# Patient Record
Sex: Male | Born: 1955 | Hispanic: No | Marital: Married | Smoking: Never smoker
Health system: Southern US, Community
[De-identification: ages and names within clinical notes are randomized; demographics above are authoritative.]

## PROBLEM LIST (undated history)

## (undated) DIAGNOSIS — M1990 Unspecified osteoarthritis, unspecified site: Secondary | ICD-10-CM

## (undated) DIAGNOSIS — J45909 Unspecified asthma, uncomplicated: Secondary | ICD-10-CM

## (undated) DIAGNOSIS — C801 Malignant (primary) neoplasm, unspecified: Secondary | ICD-10-CM

## (undated) DIAGNOSIS — R011 Cardiac murmur, unspecified: Secondary | ICD-10-CM

## (undated) DIAGNOSIS — H9190 Unspecified hearing loss, unspecified ear: Secondary | ICD-10-CM

## (undated) DIAGNOSIS — J189 Pneumonia, unspecified organism: Secondary | ICD-10-CM

## (undated) DIAGNOSIS — G4730 Sleep apnea, unspecified: Secondary | ICD-10-CM

## (undated) HISTORY — PX: COLONOSCOPY W/ BIOPSIES: SHX1374

## (undated) HISTORY — PX: LYMPH NODE BIOPSY: SHX201

## (undated) HISTORY — PX: FRACTURE SURGERY: SHX138

## (undated) HISTORY — PX: OTHER SURGICAL HISTORY: SHX169

---

## 2003-04-16 DIAGNOSIS — C801 Malignant (primary) neoplasm, unspecified: Secondary | ICD-10-CM

## 2003-04-16 HISTORY — DX: Malignant (primary) neoplasm, unspecified: C80.1

## 2003-06-27 ENCOUNTER — Encounter: Admission: RE | Admit: 2003-06-27 | Discharge: 2003-06-27 | Payer: Self-pay | Admitting: Orthopedic Surgery

## 2010-04-15 HISTORY — PX: OTHER SURGICAL HISTORY: SHX169

## 2013-02-15 ENCOUNTER — Other Ambulatory Visit: Payer: Self-pay | Admitting: Orthopedic Surgery

## 2013-02-22 ENCOUNTER — Encounter (HOSPITAL_COMMUNITY): Payer: Self-pay

## 2013-02-22 ENCOUNTER — Ambulatory Visit (HOSPITAL_COMMUNITY)
Admission: RE | Admit: 2013-02-22 | Discharge: 2013-02-22 | Disposition: A | Discharge status: Home / Self Care | Payer: BC Managed Care – PPO | Source: Ambulatory Visit | Attending: Orthopedic Surgery | Admitting: Orthopedic Surgery

## 2013-02-22 ENCOUNTER — Encounter (HOSPITAL_COMMUNITY)
Admission: RE | Admit: 2013-02-22 | Discharge: 2013-02-22 | Disposition: A | Discharge status: Home / Self Care | Payer: BC Managed Care – PPO | Source: Ambulatory Visit | Attending: Orthopedic Surgery | Admitting: Orthopedic Surgery

## 2013-02-22 DIAGNOSIS — C9110 Chronic lymphocytic leukemia of B-cell type not having achieved remission: Secondary | ICD-10-CM | POA: Insufficient documentation

## 2013-02-22 DIAGNOSIS — Z01818 Encounter for other preprocedural examination: Secondary | ICD-10-CM | POA: Insufficient documentation

## 2013-02-22 DIAGNOSIS — G4733 Obstructive sleep apnea (adult) (pediatric): Secondary | ICD-10-CM | POA: Insufficient documentation

## 2013-02-22 HISTORY — DX: Sleep apnea, unspecified: G47.30

## 2013-02-22 HISTORY — DX: Malignant (primary) neoplasm, unspecified: C80.1

## 2013-02-22 LAB — COMPREHENSIVE METABOLIC PANEL
ALT: 48 U/L (ref 0–53)
Alkaline Phosphatase: 95 U/L (ref 39–117)
BUN: 15 mg/dL (ref 6–23)
Calcium: 10.2 mg/dL (ref 8.4–10.5)
Creatinine, Ser: 1.01 mg/dL (ref 0.50–1.35)
GFR calc Af Amer: >90 mL/min (ref >90)
Glucose, Bld: 87 mg/dL (ref 70–99)
Sodium: 145 mEq/L (ref 135–145)
Total Protein: 7.4 g/dL (ref 6.0–8.3)

## 2013-02-22 LAB — CBC WITH DIFFERENTIAL/PLATELET
Basophils Absolute: 0 10*3/uL (ref 0.0–0.1)
Basophils Relative: 0 % (ref 0–1)
Eosinophils Absolute: 0 10*3/uL (ref 0.0–0.7)
HCT: 44.2 % (ref 39.0–52.0)
Hemoglobin: 15 g/dL (ref 13.0–17.0)
Lymphs Abs: 23.2 10*3/uL — ABNORMAL HIGH (ref 0.7–4.0)
MCH: 29.6 pg (ref 26.0–34.0)
MCHC: 33.9 g/dL (ref 30.0–36.0)
Monocytes Absolute: 0.9 10*3/uL (ref 0.1–1.0)
Neutro Abs: 6.4 10*3/uL (ref 1.7–7.7)
Neutrophils Relative %: 21 % — ABNORMAL LOW (ref 43–77)
Platelets: 167 10*3/uL (ref 150–400)

## 2013-02-22 LAB — URINALYSIS, ROUTINE W REFLEX MICROSCOPIC
Glucose, UA: NEGATIVE mg/dL
Ketones, ur: NEGATIVE mg/dL
Leukocytes, UA: NEGATIVE
Protein, ur: NEGATIVE mg/dL
Specific Gravity, Urine: 1.018 (ref 1.005–1.030)
Urobilinogen, UA: 1 mg/dL (ref 0.0–1.0)

## 2013-02-22 LAB — TYPE AND SCREEN
ABO/RH(D): O POS
Antibody Screen: NEGATIVE

## 2013-02-22 LAB — PROTIME-INR
INR: 0.91 (ref 0.00–1.49)
Prothrombin Time: 12.1 seconds (ref 11.6–15.2)

## 2013-02-22 LAB — SURGICAL PCR SCREEN: Staphylococcus aureus: NEGATIVE

## 2013-02-22 LAB — ABO/RH: ABO/RH(D): O POS

## 2013-02-22 MED ORDER — CHLORHEXIDINE GLUCONATE 4 % EX LIQD: 60.0000 mL | Freq: Once | CUTANEOUS | Status: DC

## 2013-02-22 NOTE — Pre-Procedure Instructions (Addendum)
Prentis Langdon  02/22/2013   Your procedure is scheduled on:  Monday March 01, 2013 @ 0730  Report to [facility] Short Stay Entrance A at 0530 AM.  Call this number if you have problems the morning of surgery: [phone]   Remember:   Do not eat food or drink liquids after midnight.   Take these medicines the morning of surgery with A SIP OF WATER: acetaminophen (Tylenol) if needed, cetirizine (Zyrtec), tramadol (Ultram) if needed   Do not wear jewelry.  Do not wear lotions, powders, or colognes. You may wear deodorant.   Men may shave face and neck.  Do not bring valuables to the hospital.  [facility] is not responsible                  for any belongings or valuables.               Contacts, dentures or bridgework may not be worn into surgery.  Leave suitcase in the car. After surgery it may be brought to your room.  For patients admitted to the hospital, discharge time is determined by your                treatment team.               Special Instructions:    Please read over the following fact sheets that you were given: Pain Booklet, Coughing and Deep Breathing, Blood Transfusion Information, MRSA Information and Surgical Site Infection Prevention

## 2013-02-22 NOTE — Progress Notes (Signed)
Pt reports PCP is Dr. Tanda Rockers and that EKG was done within the last month, records requested for office visit, medical clearance and EKG. Pt reports that Dr. Tanda Rockers is with [facility] in [place] [phone].  Pt denies having a Cardiologist, stress test or ECHO.

## 2013-02-22 NOTE — Progress Notes (Signed)
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 Chart given to [place], [place] for review.

## 2013-02-22 NOTE — Pre-Procedure Instructions (Deleted)
Sean Parks  02/22/2013   Your procedure is scheduled on:  Mon, Nov 17 @ 7:30 AM  Report to [facility] Short Stay Entrance A at 5:30 AM.  Call this number if you have problems the morning of surgery: [phone]   Remember:   Do not eat food or drink liquids after midnight.   Take these medicines the morning of surgery with A SIP OF WATER:    Do not wear jewelry  Do not wear lotions, powders, or colognes. You may wear deodorant.  Men may shave face and neck.  Do not bring valuables to the hospital.  [facility] is not responsible                  for any belongings or valuables.               Contacts, dentures or bridgework may not be worn into surgery.  Leave suitcase in the car. After surgery it may be brought to your room.  For patients admitted to the hospital, discharge time is determined by your                treatment team.                   Special Instructions: Shower using CHG 2 nights before surgery and the night before surgery.  If you shower the day of surgery use CHG.  Use special wash - you have one bottle of CHG for all showers.  You should use approximately 1/3 of the bottle for each shower.   Please read over the following fact sheets that you were given: Pain Booklet, Coughing and Deep Breathing, Blood Transfusion Information, MRSA Information and Surgical Site Infection Prevention

## 2013-02-23 ENCOUNTER — Encounter (HOSPITAL_COMMUNITY): Payer: Self-pay

## 2013-02-23 LAB — PATHOLOGIST SMEAR REVIEW

## 2013-02-23 LAB — URINE CULTURE: Culture: NO GROWTH

## 2013-02-23 NOTE — Progress Notes (Addendum)
Anesthesia Chart Review:  Patient is a 57 year old male scheduled for left TKA on 03/01/13 by Dr. Sherlean Foot.  History includes non-smoker, OSA wears CPAP, morbid obesity, CLL. PCP is Dr. Tanda Rockers with [facility] in [place].   CXR on 02/22/13 showed no acute cardiopulmonary process.   Preoperative labs noted. Urine culture is still pending.  Medical clearance and EKG are pending from Dr. Tanda Rockers.  Will follow-up records tomorrow.  Velna Ochs [facility] Short Stay Center/Anesthesiology Phone [phone] 02/23/2013 5:49 PM  Addendum: 02/24/2013 4:00 PM I spoke with Keri at Dr. Tobin Chad office.  She will have Dr. Sherlean Foot or PA review CBC.  She did fax over his medical clearance note from Dr. Tanda Rockers along with his EKG done there on 01/29/13 that showed SR, inferior infarct (age undetermined), low voltage QRS in precordial leads. This was already reviewed by Dr. Tanda Rockers who checked "clearance for surgery Medical & Cardiac standpoint."  He did recommend continuing CPAP.  I also obtained oncology records from [facility]. Patient sees Dr. Doreatha Massed, last visit 01/04/13 for follow-up stage I chronic lymphocytic leukemia.  He was aware of plans for patient to undergo TKR and felt patient could proceed from his standpoint.  Continued follow-up in six months recommended.  WBC then was 28.7. His WBC is now 30.5.  Urine culture was negative.  Patient has both medical and oncology clearance.  This WBC is consistent with his known diagnosis of CLL.  If no acute changes then I would anticipate that he could proceed as planned.

## 2013-02-28 MED ORDER — DEXTROSE 5 % IV SOLN
3.0000 g | INTRAVENOUS | Status: DC
  Filled 2013-02-28: qty 3000

## 2013-03-01 ENCOUNTER — Encounter (HOSPITAL_COMMUNITY)
Admission: RE | Disposition: A | Discharge status: Home Health | Payer: Self-pay | Source: Ambulatory Visit | Attending: Orthopedic Surgery

## 2013-03-01 ENCOUNTER — Inpatient Hospital Stay (HOSPITAL_COMMUNITY)
Admission: RE | Admit: 2013-03-01 | Discharge: 2013-03-02 | DRG: 470 | Disposition: A | Discharge status: Home Health | Payer: BC Managed Care – PPO | Source: Ambulatory Visit | Attending: Orthopedic Surgery | Admitting: Orthopedic Surgery

## 2013-03-01 ENCOUNTER — Encounter (HOSPITAL_COMMUNITY): Payer: Self-pay | Admitting: Certified Registered"

## 2013-03-01 ENCOUNTER — Inpatient Hospital Stay (HOSPITAL_COMMUNITY): Payer: BC Managed Care – PPO | Admitting: Certified Registered"

## 2013-03-01 ENCOUNTER — Encounter (HOSPITAL_COMMUNITY): Payer: BC Managed Care – PPO | Admitting: Vascular Surgery

## 2013-03-01 DIAGNOSIS — C9110 Chronic lymphocytic leukemia of B-cell type not having achieved remission: Secondary | ICD-10-CM | POA: Diagnosis present

## 2013-03-01 DIAGNOSIS — G4730 Sleep apnea, unspecified: Secondary | ICD-10-CM | POA: Diagnosis present

## 2013-03-01 DIAGNOSIS — M1710 Unilateral primary osteoarthritis, unspecified knee: Principal | ICD-10-CM | POA: Diagnosis present

## 2013-03-01 DIAGNOSIS — Z6841 Body Mass Index (BMI) 40.0 and over, adult: Secondary | ICD-10-CM

## 2013-03-01 DIAGNOSIS — Z96652 Presence of left artificial knee joint: Secondary | ICD-10-CM

## 2013-03-01 DIAGNOSIS — D62 Acute posthemorrhagic anemia: Secondary | ICD-10-CM | POA: Diagnosis not present

## 2013-03-01 HISTORY — PX: TOTAL KNEE ARTHROPLASTY: SHX125

## 2013-03-01 LAB — CBC
HCT: 41.5 % (ref 39.0–52.0)
MCH: 28.7 pg (ref 26.0–34.0)
MCHC: 33 g/dL (ref 30.0–36.0)
Platelets: 173 10*3/uL (ref 150–400)
RDW: 13.6 % (ref 11.5–15.5)
WBC: 37.8 10*3/uL — ABNORMAL HIGH (ref 4.0–10.5)

## 2013-03-01 LAB — CREATININE, SERUM
GFR calc Af Amer: >90 mL/min (ref >90)
GFR calc non Af Amer: >90 mL/min (ref >90)

## 2013-03-01 SURGERY — ARTHROPLASTY, KNEE, TOTAL
Anesthesia: Regional | Site: Knee | Laterality: Left | Wound class: Clean

## 2013-03-01 MED ORDER — BUPIVACAINE-EPINEPHRINE PF 0.5-1:200000 % IJ SOLN
INTRAMUSCULAR | Status: DC | PRN
  Administered 2013-03-01: 20 mL

## 2013-03-01 MED ORDER — PHENOL 1.4 % MT LIQD: 1.0000 | OROMUCOSAL | Status: DC | PRN

## 2013-03-01 MED ORDER — SENNOSIDES-DOCUSATE SODIUM 8.6-50 MG PO TABS: 1.0000 | ORAL_TABLET | Freq: Every evening | ORAL | Status: DC | PRN

## 2013-03-01 MED ORDER — NEOSTIGMINE METHYLSULFATE 1 MG/ML IJ SOLN
INTRAMUSCULAR | Status: DC | PRN
  Administered 2013-03-01: 4 mg via INTRAVENOUS

## 2013-03-01 MED ORDER — METHOCARBAMOL 500 MG PO TABS
500.0000 mg | ORAL_TABLET | Freq: Four times a day (QID) | ORAL | Status: DC | PRN
  Administered 2013-03-01 – 2013-03-02 (×3): 500 mg via ORAL
  Filled 2013-03-01 (×2): qty 1

## 2013-03-01 MED ORDER — CEFAZOLIN SODIUM-DEXTROSE 2-3 GM-% IV SOLR
2.0000 g | Freq: Four times a day (QID) | INTRAVENOUS | Status: AC
  Administered 2013-03-01 (×2): 2 g via INTRAVENOUS
  Filled 2013-03-01 (×2): qty 50

## 2013-03-01 MED ORDER — HYDROMORPHONE HCL PF 1 MG/ML IJ SOLN
INTRAMUSCULAR | Status: AC
  Administered 2013-03-01: 0.5 mg via INTRAVENOUS
  Filled 2013-03-01: qty 1

## 2013-03-01 MED ORDER — OXYCODONE HCL 5 MG PO TABS: 5.0000 mg | ORAL_TABLET | Freq: Once | ORAL | Status: DC | PRN

## 2013-03-01 MED ORDER — FENTANYL CITRATE 0.05 MG/ML IJ SOLN
INTRAMUSCULAR | Status: DC | PRN
  Administered 2013-03-01: 100 ug via INTRAVENOUS
  Administered 2013-03-01 (×4): 50 ug via INTRAVENOUS

## 2013-03-01 MED ORDER — PROPOFOL 10 MG/ML IV BOLUS
INTRAVENOUS | Status: DC | PRN
  Administered 2013-03-01: 200 mg via INTRAVENOUS

## 2013-03-01 MED ORDER — DOCUSATE SODIUM 100 MG PO CAPS
100.0000 mg | ORAL_CAPSULE | Freq: Two times a day (BID) | ORAL | Status: DC
  Administered 2013-03-01 – 2013-03-02 (×3): 100 mg via ORAL
  Filled 2013-03-01 (×4): qty 1

## 2013-03-01 MED ORDER — ONDANSETRON HCL 4 MG/2ML IJ SOLN
INTRAMUSCULAR | Status: DC | PRN
  Administered 2013-03-01: 4 mg via INTRAVENOUS

## 2013-03-01 MED ORDER — ZOLPIDEM TARTRATE 5 MG PO TABS: 5.0000 mg | ORAL_TABLET | Freq: Every evening | ORAL | Status: DC | PRN

## 2013-03-01 MED ORDER — SODIUM CHLORIDE 0.9 % IR SOLN
Status: DC | PRN
  Administered 2013-03-01: 3000 mL

## 2013-03-01 MED ORDER — ONDANSETRON HCL 4 MG PO TABS: 4.0000 mg | ORAL_TABLET | Freq: Four times a day (QID) | ORAL | Status: DC | PRN

## 2013-03-01 MED ORDER — MINOCYCLINE HCL 50 MG PO CAPS
50.0000 mg | ORAL_CAPSULE | Freq: Every day | ORAL | Status: DC | PRN
  Filled 2013-03-01: qty 1

## 2013-03-01 MED ORDER — METHOCARBAMOL 100 MG/ML IJ SOLN
500.0000 mg | Freq: Four times a day (QID) | INTRAVENOUS | Status: DC | PRN
  Filled 2013-03-01: qty 5

## 2013-03-01 MED ORDER — GLYCOPYRROLATE 0.2 MG/ML IJ SOLN
INTRAMUSCULAR | Status: DC | PRN
  Administered 2013-03-01: 0.6 mg via INTRAVENOUS

## 2013-03-01 MED ORDER — LORATADINE 10 MG PO TABS
10.0000 mg | ORAL_TABLET | Freq: Every day | ORAL | Status: DC
  Administered 2013-03-01 – 2013-03-02 (×2): 10 mg via ORAL
  Filled 2013-03-01 (×2): qty 1

## 2013-03-01 MED ORDER — METOCLOPRAMIDE HCL 5 MG/ML IJ SOLN: 5.0000 mg | Freq: Three times a day (TID) | INTRAMUSCULAR | Status: DC | PRN

## 2013-03-01 MED ORDER — BUPIVACAINE LIPOSOME 1.3 % IJ SUSP
INTRAMUSCULAR | Status: DC | PRN
  Administered 2013-03-01: 20 mL

## 2013-03-01 MED ORDER — ONDANSETRON HCL 4 MG/2ML IJ SOLN: 4.0000 mg | Freq: Four times a day (QID) | INTRAMUSCULAR | Status: DC | PRN

## 2013-03-01 MED ORDER — FLEET ENEMA 7-19 GM/118ML RE ENEM: 1.0000 | ENEMA | Freq: Once | RECTAL | Status: AC | PRN

## 2013-03-01 MED ORDER — HYDROMORPHONE HCL PF 1 MG/ML IJ SOLN
1.0000 mg | INTRAMUSCULAR | Status: DC | PRN
  Administered 2013-03-01 (×2): 1 mg via INTRAVENOUS
  Filled 2013-03-01 (×2): qty 1

## 2013-03-01 MED ORDER — BISACODYL 5 MG PO TBEC: 5.0000 mg | DELAYED_RELEASE_TABLET | Freq: Every day | ORAL | Status: DC | PRN

## 2013-03-01 MED ORDER — ROCURONIUM BROMIDE 100 MG/10ML IV SOLN
INTRAVENOUS | Status: DC | PRN
  Administered 2013-03-01: 50 mg via INTRAVENOUS

## 2013-03-01 MED ORDER — BUPIVACAINE LIPOSOME 1.3 % IJ SUSP
20.0000 mL | Freq: Once | INTRAMUSCULAR | Status: DC
  Filled 2013-03-01: qty 20

## 2013-03-01 MED ORDER — OXYCODONE HCL ER 20 MG PO T12A
20.0000 mg | EXTENDED_RELEASE_TABLET | Freq: Two times a day (BID) | ORAL | Status: DC
  Administered 2013-03-01 – 2013-03-02 (×3): 20 mg via ORAL
  Filled 2013-03-01 (×3): qty 2

## 2013-03-01 MED ORDER — LACTATED RINGERS IV SOLN
INTRAVENOUS | Status: DC | PRN
  Administered 2013-03-01 (×2): via INTRAVENOUS

## 2013-03-01 MED ORDER — LIDOCAINE HCL (CARDIAC) 20 MG/ML IV SOLN
INTRAVENOUS | Status: DC | PRN
  Administered 2013-03-01: 80 mg via INTRAVENOUS

## 2013-03-01 MED ORDER — ACETAMINOPHEN 325 MG PO TABS: 650.0000 mg | ORAL_TABLET | Freq: Four times a day (QID) | ORAL | Status: DC | PRN

## 2013-03-01 MED ORDER — ALUM & MAG HYDROXIDE-SIMETH 200-200-20 MG/5ML PO SUSP: 30.0000 mL | ORAL | Status: DC | PRN

## 2013-03-01 MED ORDER — TRANEXAMIC ACID 100 MG/ML IV SOLN
1000.0000 mg | INTRAVENOUS | Status: AC
  Administered 2013-03-01: 1000 mg via INTRAVENOUS
  Filled 2013-03-01: qty 10

## 2013-03-01 MED ORDER — HYDROMORPHONE HCL PF 1 MG/ML IJ SOLN
0.2500 mg | INTRAMUSCULAR | Status: DC | PRN
  Administered 2013-03-01 (×2): 0.5 mg via INTRAVENOUS

## 2013-03-01 MED ORDER — ACETAMINOPHEN 650 MG RE SUPP: 650.0000 mg | Freq: Four times a day (QID) | RECTAL | Status: DC | PRN

## 2013-03-01 MED ORDER — SODIUM CHLORIDE 0.9 % IV SOLN
INTRAVENOUS | Status: DC
  Administered 2013-03-01: 1 mL via INTRAVENOUS

## 2013-03-01 MED ORDER — DEXTROSE 5 % IV SOLN
3.0000 g | INTRAVENOUS | Status: DC | PRN
  Administered 2013-03-01: 3 g via INTRAVENOUS

## 2013-03-01 MED ORDER — MIDAZOLAM HCL 5 MG/5ML IJ SOLN
INTRAMUSCULAR | Status: DC | PRN
  Administered 2013-03-01: 2 mg via INTRAVENOUS

## 2013-03-01 MED ORDER — OXYCODONE HCL 5 MG PO TABS
ORAL_TABLET | ORAL | Status: AC
  Filled 2013-03-01: qty 2

## 2013-03-01 MED ORDER — BUPIVACAINE-EPINEPHRINE PF 0.5-1:200000 % IJ SOLN
INTRAMUSCULAR | Status: DC | PRN
  Administered 2013-03-01: 30 mL

## 2013-03-01 MED ORDER — CELECOXIB 200 MG PO CAPS
200.0000 mg | ORAL_CAPSULE | Freq: Two times a day (BID) | ORAL | Status: DC
  Administered 2013-03-01 – 2013-03-02 (×3): 200 mg via ORAL
  Filled 2013-03-01 (×4): qty 1

## 2013-03-01 MED ORDER — OXYCODONE HCL 5 MG/5ML PO SOLN: 5.0000 mg | Freq: Once | ORAL | Status: DC | PRN

## 2013-03-01 MED ORDER — ENOXAPARIN SODIUM 30 MG/0.3ML ~~LOC~~ SOLN
30.0000 mg | Freq: Two times a day (BID) | SUBCUTANEOUS | Status: DC
  Administered 2013-03-02: 30 mg via SUBCUTANEOUS
  Filled 2013-03-01 (×3): qty 0.3

## 2013-03-01 MED ORDER — BUPIVACAINE-EPINEPHRINE (PF) 0.5% -1:200000 IJ SOLN
INTRAMUSCULAR | Status: AC
  Filled 2013-03-01: qty 10

## 2013-03-01 MED ORDER — OXYCODONE HCL 5 MG PO TABS
5.0000 mg | ORAL_TABLET | ORAL | Status: DC | PRN
  Administered 2013-03-01 – 2013-03-02 (×7): 10 mg via ORAL
  Filled 2013-03-01 (×6): qty 2

## 2013-03-01 MED ORDER — DIPHENHYDRAMINE HCL 12.5 MG/5ML PO ELIX: 12.5000 mg | ORAL_SOLUTION | ORAL | Status: DC | PRN

## 2013-03-01 MED ORDER — METOCLOPRAMIDE HCL 10 MG PO TABS: 5.0000 mg | ORAL_TABLET | Freq: Three times a day (TID) | ORAL | Status: DC | PRN

## 2013-03-01 MED ORDER — ARTIFICIAL TEARS OP OINT
TOPICAL_OINTMENT | OPHTHALMIC | Status: DC | PRN
  Administered 2013-03-01: 1 via OPHTHALMIC

## 2013-03-01 MED ORDER — METHOCARBAMOL 500 MG PO TABS
ORAL_TABLET | ORAL | Status: AC
  Filled 2013-03-01: qty 1

## 2013-03-01 MED ORDER — MENTHOL 3 MG MT LOZG: 1.0000 | LOZENGE | OROMUCOSAL | Status: DC | PRN

## 2013-03-01 SURGICAL SUPPLY — 63 items
BANDAGE ESMARK 6X9 LF (GAUZE/BANDAGES/DRESSINGS) ×1 IMPLANT
BLADE SAGITTAL 13X1.27X60 (BLADE) ×2 IMPLANT
BLADE SAW SGTL 83.5X18.5 (BLADE) ×2 IMPLANT
BLADE SURG 10 STRL SS (BLADE) ×2 IMPLANT
BNDG ESMARK 6X9 LF (GAUZE/BANDAGES/DRESSINGS) ×2
BOWL SMART MIX CTS (DISPOSABLE) ×2 IMPLANT
CAP POR TM CP VIT E LN CER HD ×2 IMPLANT
CEMENT BONE SIMPLEX SPEEDSET (Cement) ×4 IMPLANT
CLOTH BEACON ORANGE TIMEOUT ST (SAFETY) IMPLANT
COVER SURGICAL LIGHT HANDLE (MISCELLANEOUS) ×2 IMPLANT
CUFF TOURNIQUET SINGLE 34IN LL (TOURNIQUET CUFF) ×2 IMPLANT
DRAPE EXTREMITY T 121X128X90 (DRAPE) ×2 IMPLANT
DRAPE INCISE IOBAN 66X45 STRL (DRAPES) ×4 IMPLANT
DRAPE PROXIMA HALF (DRAPES) ×2 IMPLANT
DRAPE U-SHAPE 47X51 STRL (DRAPES) ×2 IMPLANT
DRSG ADAPTIC 3X8 NADH LF (GAUZE/BANDAGES/DRESSINGS) ×2 IMPLANT
DRSG PAD ABDOMINAL 8X10 ST (GAUZE/BANDAGES/DRESSINGS) ×2 IMPLANT
DURAPREP 26ML APPLICATOR (WOUND CARE) ×2 IMPLANT
ELECT REM PT RETURN 9FT ADLT (ELECTROSURGICAL) ×2
ELECTRODE REM PT RTRN 9FT ADLT (ELECTROSURGICAL) ×1 IMPLANT
EVACUATOR 1/8 PVC DRAIN (DRAIN) ×2 IMPLANT
GLOVE BIO SURGEON STRL SZ 6.5 (GLOVE) ×2 IMPLANT
GLOVE BIOGEL M 7.0 STRL (GLOVE) IMPLANT
GLOVE BIOGEL PI IND STRL 6.5 (GLOVE) ×1 IMPLANT
GLOVE BIOGEL PI IND STRL 7.5 (GLOVE) ×1 IMPLANT
GLOVE BIOGEL PI IND STRL 8.5 (GLOVE) ×3 IMPLANT
GLOVE BIOGEL PI INDICATOR 6.5 (GLOVE) ×1
GLOVE BIOGEL PI INDICATOR 7.5 (GLOVE) ×1
GLOVE BIOGEL PI INDICATOR 8.5 (GLOVE) ×3
GLOVE SURG ORTHO 8.0 STRL STRW (GLOVE) ×4 IMPLANT
GLOVE SURG SS PI 8.5 STRL IVOR (GLOVE) ×1
GLOVE SURG SS PI 8.5 STRL STRW (GLOVE) ×1 IMPLANT
GOWN PREVENTION PLUS XLARGE (GOWN DISPOSABLE) ×4 IMPLANT
GOWN SRG XL XLNG 56XLVL 4 (GOWN DISPOSABLE) ×1 IMPLANT
GOWN STRL NON-REIN LRG LVL3 (GOWN DISPOSABLE) IMPLANT
GOWN STRL NON-REIN XL XLG LVL4 (GOWN DISPOSABLE) ×1
GOWN STRL REIN 2XL XLG LVL4 (GOWN DISPOSABLE) ×2 IMPLANT
HANDPIECE INTERPULSE COAX TIP (DISPOSABLE) ×1
HOOD PEEL AWAY FACE SHEILD DIS (HOOD) ×8 IMPLANT
KIT BASIN OR (CUSTOM PROCEDURE TRAY) ×2 IMPLANT
KIT ROOM TURNOVER OR (KITS) ×2 IMPLANT
MANIFOLD NEPTUNE II (INSTRUMENTS) ×2 IMPLANT
NEEDLE 22X1 1/2 (OR ONLY) (NEEDLE) ×2 IMPLANT
NEEDLE HYPO 21X1.5 SAFETY (NEEDLE) IMPLANT
NS IRRIG 1000ML POUR BTL (IV SOLUTION) ×2 IMPLANT
PACK TOTAL JOINT (CUSTOM PROCEDURE TRAY) ×2 IMPLANT
PAD ARMBOARD 7.5X6 YLW CONV (MISCELLANEOUS) ×4 IMPLANT
PADDING CAST COTTON 6X4 STRL (CAST SUPPLIES) ×2 IMPLANT
SET HNDPC FAN SPRY TIP SCT (DISPOSABLE) ×1 IMPLANT
SPONGE GAUZE 4X4 12PLY (GAUZE/BANDAGES/DRESSINGS) ×2 IMPLANT
STAPLER VISISTAT 35W (STAPLE) ×2 IMPLANT
SUCTION FRAZIER TIP 10 FR DISP (SUCTIONS) ×2 IMPLANT
SUT BONE WAX W31G (SUTURE) ×2 IMPLANT
SUT VIC AB 0 CTB1 27 (SUTURE) ×4 IMPLANT
SUT VIC AB 1 CT1 27 (SUTURE) ×4
SUT VIC AB 1 CT1 27XBRD ANBCTR (SUTURE) ×4 IMPLANT
SUT VIC AB 2-0 CT1 27 (SUTURE) ×2
SUT VIC AB 2-0 CT1 TAPERPNT 27 (SUTURE) ×2 IMPLANT
SYR CONTROL 10ML LL (SYRINGE) ×2 IMPLANT
TOWEL OR 17X24 6PK STRL BLUE (TOWEL DISPOSABLE) ×2 IMPLANT
TOWEL OR 17X26 10 PK STRL BLUE (TOWEL DISPOSABLE) ×2 IMPLANT
TRAY FOLEY CATH 16FRSI W/METER (SET/KITS/TRAYS/PACK) IMPLANT
WATER STERILE IRR 1000ML POUR (IV SOLUTION) ×2 IMPLANT

## 2013-03-01 NOTE — Anesthesia Postprocedure Evaluation (Signed)
Anesthesia Post Note  Patient: Sean Parks  Procedure(s) Performed: Procedure(s) (LRB): TOTAL KNEE ARTHROPLASTY (Left)  Anesthesia type: General  Patient location: PACU  Post pain: Pain level controlled and Adequate analgesia  Post assessment: Post-op Vital signs reviewed, Patient's Cardiovascular Status Stable, Respiratory Function Stable, Patent Airway and Pain level controlled  Last Vitals:  Filed Vitals:   03/01/13 1045  BP: 149/80  Pulse: 105  Temp:   Resp: 10    Post vital signs: Reviewed and stable  Level of consciousness: awake, alert  and oriented  Complications: No apparent anesthesia complications

## 2013-03-01 NOTE — H&P (Signed)
Sean Parks MRN:  [id number] DOB/SEX:  1955-09-05/male  CHIEF COMPLAINT:  Painful left Knee  HISTORY: Patient is a 57 y.o. male presented with a history of pain in the left knee. Onset of symptoms was gradual starting several years ago with gradually worsening course since that time. Prior procedures on the knee include none. Patient has been treated conservatively with over-the-counter NSAIDs and activity modification. Patient currently rates pain in the knee at 10 out of 10 with activity. There is pain at night.  PAST MEDICAL HISTORY: There are no active problems to display for this patient.  Past Medical History  Diagnosis Date  . 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 Sleep apnea     Wears CPAP   Past Surgical History  Procedure Laterality Date  . Bone spurs   2012    Removed from L shoulder  . Fracture surgery      L foot when pt was 57 years old  . Colonoscopy w/ biopsies    . Lymph node biopsy       MEDICATIONS:   Prescriptions prior to admission  Medication Sig Dispense Refill  . acetaminophen (TYLENOL) 500 MG tablet Take 1,000 mg by mouth every 6 (six) hours as needed for mild pain.      . cetirizine-pseudoephedrine (ZYRTEC-D) 5-120 MG per tablet Take 1 tablet by mouth daily as needed for allergies.      . diphenhydramine-acetaminophen (TYLENOL PM) 25-500 MG TABS Take 2 tablets by mouth at bedtime as needed.      . minocycline (MINOCIN,DYNACIN) 50 MG capsule Take 50 mg by mouth daily as needed (flare up).      . traMADol (ULTRAM) 50 MG tablet Take 50-100 mg by mouth every 6 (six) hours as needed for moderate pain.        ALLERGIES:  No Known Allergies  REVIEW OF SYSTEMS:  Pertinent items are noted in HPI.   FAMILY HISTORY:  No family history on file.  SOCIAL HISTORY:   History  Substance Use Topics  . Smoking status: Never Smoker   . Smokeless tobacco: Never Used  . Alcohol Use: 1.2 oz/week    2 Glasses of wine per week     Comment: twice a week     EXAMINATION:  Vital signs in last 24 hours: Temp:  [97.1 F (36.2 C)] 97.1 F (36.2 C) (11/17 0555) Pulse Rate:  [95] 95 (11/17 0555) Resp:  [20] 20 (11/17 0555) BP: (128)/(64) 128/64 mmHg (11/17 0555) SpO2:  [97 %] 97 % (11/17 0555)  General appearance: alert, cooperative and no distress Lungs: clear to auscultation bilaterally Heart: regular rate and rhythm, S1, S2 normal, no murmur, click, rub or gallop Abdomen: soft, non-tender; bowel sounds normal; no masses,  no organomegaly Extremities: Homans sign is negative, no sign of DVT Pulses: 2+ and symmetric Skin: Skin color, texture, turgor normal. No rashes or lesions Neurologic: Alert and oriented X 3, normal strength and tone. Normal symmetric reflexes. Normal coordination and gait  Musculoskeletal:  ROM 0-115, Ligaments intact,  Imaging Review Plain radiographs demonstrate severe degenerative joint disease of the left knee. The overall alignment is mild valgus. The bone quality appears to be good for age and reported activity level.  Assessment/Plan: End stage arthritis, left knee   The patient history, physical examination and imaging studies are consistent with advanced degenerative joint disease of the left knee. The patient has failed conservative treatment.  The clearance notes were reviewed.  After discussion with the patient it was felt that Total Knee Replacement was indicated. The procedure,  risks, and benefits of total knee arthroplasty were presented and reviewed. The risks including but not limited to aseptic loosening, infection, blood clots, vascular injury, stiffness, patella tracking problems complications among others were discussed. The patient acknowledged the explanation, agreed to proceed with the plan.  Sean Parks 03/01/2013, 6:54 AM

## 2013-03-01 NOTE — Anesthesia Procedure Notes (Addendum)
Anesthesia Regional Block:  Adductor canal block  Pre-Anesthetic Checklist: ,, timeout performed, Correct Patient, Correct Site, Correct Laterality, Correct Procedure, Correct Position, site marked, Risks and benefits discussed,  Surgical consent,  Pre-op evaluation,  At surgeon's request and post-op pain management  Laterality: Left  Prep: chloraprep       Needles:  Injection technique: Single-shot  Needle Type: Echogenic Needle      Needle Gauge: 21 and 21 G    Additional Needles:  Procedures: ultrasound guided (picture in chart) Adductor canal block Narrative:  Start time: 03/01/2013 7:10 AM End time: 03/01/2013 7:22 AM Injection made incrementally with aspirations every 5 mL.  Performed by: Personally  Anesthesiologist: Dr Chaney Malling  Additional Notes: Pt tolerated the procedure well.  Adductor canal block Procedure Name: Intubation Date/Time: 03/01/2013 7:51 AM Performed by: Lanell Matar Pre-anesthesia Checklist: Patient identified, Timeout performed, Emergency Drugs available, Suction available and Patient being monitored Patient Re-evaluated:Patient Re-evaluated prior to inductionOxygen Delivery Method: Circle system utilized Preoxygenation: Pre-oxygenation with 100% oxygen Intubation Type: IV induction Ventilation: Two handed mask ventilation required and Oral airway inserted - appropriate to patient size Laryngoscope Size: Hyacinth Meeker and 2 Grade View: Grade II Tube type: Oral Tube size: 7.5 mm Number of attempts: 1 Airway Equipment and Method: Stylet Placement Confirmation: ETT inserted through vocal cords under direct vision,  positive ETCO2,  CO2 detector and breath sounds checked- equal and bilateral Secured at: 23 cm Tube secured with: Tape Dental Injury: Teeth and Oropharynx as per pre-operative assessment

## 2013-03-01 NOTE — Progress Notes (Signed)
Orthopedic Tech Progress Note Patient Details:  Sean Parks 1956-02-01 [id number] CPM applied to Left LE with appropriate settings. OHF applied to bed. Footsie roll provided.  CPM Left Knee CPM Left Knee: On Left Knee Flexion (Degrees): 90 Left Knee Extension (Degrees): 0   Asia R Thompson 03/01/2013, 11:03 AM

## 2013-03-01 NOTE — Evaluation (Signed)
Physical Therapy Evaluation Patient Details Name: Sean Parks MRN: [id number] DOB: 21-Nov-1955 Today's Date: 03/01/2013 Time: 6962-9528 PT Time Calculation (min): 24 min  PT Assessment / Plan / Recommendation History of Present Illness  Patient is a 57 yo male s/p Lt TKA.  Clinical Impression  Patient presents with problems listed below. Will benefit from acute PT to maximize independence prior to discharge home with wife.     PT Assessment  Patient needs continued PT services    Follow Up Recommendations  Home health PT;Supervision/Assistance - 24 hour    Does the patient have the potential to tolerate intense rehabilitation      Barriers to Discharge        Equipment Recommendations  None recommended by PT    Recommendations for Other Services     Frequency 7X/week    Precautions / Restrictions Precautions Precautions: Knee Precaution Booklet Issued: Yes (comment) Precaution Comments: Reviewed precautions with patient and wife. Restrictions Weight Bearing Restrictions: Yes LLE Weight Bearing: Weight bearing as tolerated   Pertinent Vitals/Pain       Mobility  Bed Mobility Bed Mobility: Supine to Sit;Sitting - Scoot to Edge of Bed Supine to Sit: 4: Min assist;HOB flat;With rails Sitting - Scoot to Edge of Bed: 4: Min guard;With rail Details for Bed Mobility Assistance: Verbal cues for technique.  Assist to move LLE off of bed.  Good sitting balance once upright. Transfers Transfers: Sit to Stand;Stand to Sit Sit to Stand: 4: Min assist;From elevated surface;With upper extremity assist;From bed Stand to Sit: 4: Min assist;With upper extremity assist;With armrests;To chair/3-in-1 Details for Transfer Assistance: Verbal cues for hand placement and technique.  Assist to rise to standing and for balance initially. Ambulation/Gait Ambulation/Gait Assistance: 4: Min assist Ambulation Distance (Feet): 14 Feet Assistive device: Rolling walker Ambulation/Gait  Assistance Details: Verbal cues for safe use of RW and gait sequence.  Cues to stand upright during gait. Gait Pattern: Step-to pattern;Decreased stance time - left;Decreased step length - right;Antalgic;Trunk flexed Gait velocity: Slow gait speed    Exercises Total Joint Exercises Ankle Circles/Pumps: AROM;Both;10 reps;Seated   PT Diagnosis: Difficulty walking;Acute pain  PT Problem List: Decreased strength;Decreased range of motion;Decreased activity tolerance;Decreased mobility;Decreased knowledge of use of DME;Decreased knowledge of precautions;Pain PT Treatment Interventions: DME instruction;Gait training;Stair training;Functional mobility training;Therapeutic exercise;Patient/family education     PT Goals(Current goals can be found in the care plan section) Acute Rehab PT Goals Patient Stated Goal: Home soon PT Goal Formulation: With patient/family Time For Goal Achievement: 03/08/13 Potential to Achieve Goals: Good  Visit Information  Last PT Received On: 03/01/13 Assistance Needed: +1 History of Present Illness: Patient is a 57 yo male s/p Lt TKA.       Prior Functioning  Home Living Family/patient expects to be discharged to:: Private residence Living Arrangements: Spouse/significant other Available Help at Discharge: Family;Available 24 hours/day Type of Home: House Home Access: Stairs to enter [employer] of Steps: 1 Entrance Stairs-Rails: None Home Layout: One level Home Equipment: Walker - 2 wheels;Bedside commode Prior Function Level of Independence: Independent Communication Communication: No difficulties    Cognition  Cognition Arousal/Alertness: Awake/alert Behavior During Therapy: WFL for tasks assessed/performed Overall Cognitive Status: Within Functional Limits for tasks assessed    Extremity/Trunk Assessment Upper Extremity Assessment Upper Extremity Assessment: Overall WFL for tasks assessed Lower Extremity Assessment Lower Extremity  Assessment: LLE deficits/detail LLE Deficits / Details: Decreased strength and ROM due to surgery/pain.  Patient able to assist with moving LLE off of bed. LLE:  Unable to fully assess due to pain Cervical / Trunk Assessment Cervical / Trunk Assessment: Normal   Balance    End of Session PT - End of Session Equipment Utilized During Treatment: Gait belt Activity Tolerance: Patient limited by pain Patient left: in chair;with call bell/phone within reach;with family/visitor present Nurse Communication: Mobility status;Patient requests pain meds CPM Left Knee CPM Left Knee: Off  GP     Vena Austria 03/01/2013, 5:10 PM Durenda Hurt. Renaldo Fiddler, [facility] Acute Rehab Services Pager [phone]

## 2013-03-01 NOTE — Anesthesia Preprocedure Evaluation (Addendum)
Anesthesia Evaluation  Patient identified by MRN, date of birth, ID band Patient awake    Reviewed: Allergy & Precautions, H&P , NPO status , Patient's Chart, lab work & pertinent test results  Airway Mallampati: III TM Distance: >3 FB Neck ROM: full    Dental  (+) Teeth Intact and Dental Advisory Given   Pulmonary sleep apnea ,          Cardiovascular negative cardio ROS      Neuro/Psych    GI/Hepatic   Endo/Other  Morbid obesity  Renal/GU      Musculoskeletal  (+) Arthritis -,   Abdominal   Peds  Hematology H/o CLL   Anesthesia Other Findings   Reproductive/Obstetrics                          Anesthesia Physical Anesthesia Plan  ASA: III  Anesthesia Plan: General and Regional   Post-op Pain Management: MAC Combined w/ Regional for Post-op pain   Induction: Intravenous  Airway Management Planned: Oral ETT  Additional Equipment:   Intra-op Plan:   Post-operative Plan: Extubation in OR  Informed Consent: I have reviewed the patients History and Physical, chart, labs and discussed the procedure including the risks, benefits and alternatives for the proposed anesthesia with the patient or authorized representative who has indicated his/her understanding and acceptance.     Plan Discussed with: CRNA, Anesthesiologist and Surgeon  Anesthesia Plan Comments:         Anesthesia Quick Evaluation

## 2013-03-01 NOTE — Plan of Care (Signed)
Problem: Consults Goal: Diagnosis- Total Joint Replacement Outcome: Completed/Met Date Met:  03/01/13 Primary Total Knee left

## 2013-03-01 NOTE — Transfer of Care (Signed)
Immediate Anesthesia Transfer of Care Note  Patient: Sean Parks  Procedure(s) Performed: Procedure(s): TOTAL KNEE ARTHROPLASTY (Left)  Patient Location: PACU  Anesthesia Type:General  Level of Consciousness: awake, alert  and oriented  Airway & Oxygen Therapy: Patient Spontanous Breathing and Patient connected to face mask oxygen  Post-op Assessment: Report given to PACU RN and Post -op Vital signs reviewed and stable  Post vital signs: Reviewed and stable  Complications: No apparent anesthesia complications

## 2013-03-02 LAB — BASIC METABOLIC PANEL
BUN: 13 mg/dL (ref 6–23)
CO2: 26 mEq/L (ref 19–32)
Calcium: 8.6 mg/dL (ref 8.4–10.5)
Chloride: 97 mEq/L (ref 96–112)
Creatinine, Ser: 0.91 mg/dL (ref 0.50–1.35)
GFR calc Af Amer: >90 mL/min (ref >90)
GFR calc non Af Amer: >90 mL/min (ref >90)
Glucose, Bld: 124 mg/dL — ABNORMAL HIGH (ref 70–99)
Potassium: 4.7 mEq/L (ref 3.5–5.1)

## 2013-03-02 LAB — CBC
HCT: 38.2 % — ABNORMAL LOW (ref 39.0–52.0)
Hemoglobin: 12.5 g/dL — ABNORMAL LOW (ref 13.0–17.0)
MCHC: 32.7 g/dL (ref 30.0–36.0)
MCV: 88 fL (ref 78.0–100.0)
RBC: 4.34 MIL/uL (ref 4.22–5.81)
WBC: 36.8 10*3/uL — ABNORMAL HIGH (ref 4.0–10.5)

## 2013-03-02 MED ORDER — HYDROMORPHONE HCL ER 8 MG PO T24A: 8.0000 mg | EXTENDED_RELEASE_TABLET | ORAL | Status: DC

## 2013-03-02 MED ORDER — METHOCARBAMOL 500 MG PO TABS: 500.0000 mg | ORAL_TABLET | Freq: Four times a day (QID) | ORAL | Status: DC | PRN

## 2013-03-02 MED ORDER — OXYCODONE HCL 5 MG PO TABS: 5.0000 mg | ORAL_TABLET | ORAL | Status: DC | PRN

## 2013-03-02 MED ORDER — ENOXAPARIN SODIUM 40 MG/0.4ML ~~LOC~~ SOLN: 40.0000 mg | SUBCUTANEOUS | Status: DC

## 2013-03-02 NOTE — Progress Notes (Signed)
03/02/13 Set up with HHPT with [facility] by MD office. Contacted [facility], spoke with [facility], confirmed HHPT with service to start 03/03/13. Faxed order, facesheet, H and P, and PT note to [phone]. Received fax confirmation. [employer] providing CPM, patient states that he has rolling walker and 3N1 at home. No other discharge needs identified. Jacquelynn Cree RN, BSN, CCM

## 2013-03-02 NOTE — Progress Notes (Signed)
Pt discharged to home. D/c instructions given, no questions verbalized. Vitals stable. 

## 2013-03-02 NOTE — Progress Notes (Signed)
Physical Therapy Treatment Patient Details Name: Sean Parks MRN: [id number] DOB: 1955-11-22 Today's Date: 03/02/2013 Time: 4098-1191 PT Time Calculation (min): 36 min  PT Assessment / Plan / Recommendation  History of Present Illness Patient is a 57 yo male s/p Lt TKA.   PT Comments   Very good progress, though quite painful; have covered all necessary tasks for going home  Follow Up Recommendations  Home health PT;Supervision/Assistance - 24 hour     Does the patient have the potential to tolerate intense rehabilitation     Barriers to Discharge        Equipment Recommendations  None recommended by PT    Recommendations for Other Services    Frequency 7X/week   Progress towards PT Goals Progress towards PT goals: Progressing toward goals  Plan Current plan remains appropriate    Precautions / Restrictions Precautions Precautions: Knee Precaution Booklet Issued: Yes (comment) Precaution Comments: Reviewed precautions with patient and wife. Restrictions Weight Bearing Restrictions: Yes LLE Weight Bearing: Weight bearing as tolerated   Pertinent Vitals/Pain 4/10 L knee pain RN provided medication to assist with pain control     Mobility  Bed Mobility Bed Mobility: Supine to Sit;Sitting - Scoot to Edge of Bed;Sit to Supine Supine to Sit: HOB flat;With rails;4: Min guard Sitting - Scoot to Edge of Bed: 4: Min guard;With rail Sit to Supine: 4: Min guard Details for Bed Mobility Assistance: Verbal cues for technique.  Good sitting balance once upright. Transfers Transfers: Sit to Stand;Stand to Sit Sit to Stand: From elevated surface;With upper extremity assist;From bed;4: Min guard Stand to Sit: With upper extremity assist;With armrests;To chair/3-in-1;4: Min guard Details for Transfer Assistance: Verbal cues for hand placement and technique. Noted pt used momentum Ambulation/Gait Ambulation/Gait Assistance: 4: Min Government social research officer (Feet): 120  Feet Assistive device: Rolling walker Ambulation/Gait Assistance Details: Cues for gait sequence and to activate L quad for stance stability Gait Pattern: Step-to pattern;Decreased stance time - left;Decreased step length - right;Trunk flexed Gait velocity: Slow gait speed    Exercises Total Joint Exercises Quad Sets: AROM;Left;10 reps;Supine Short Arc Quad: AAROM;AROM;Left;10 reps;Supine Straight Leg Raises: AAROM;AROM;Left;10 reps;Supine   PT Diagnosis:    PT Problem List:   PT Treatment Interventions:     PT Goals (current goals can now be found in the care plan section) Acute Rehab PT Goals Patient Stated Goal: Home soon PT Goal Formulation: With patient/family Time For Goal Achievement: 03/08/13 Potential to Achieve Goals: Good  Visit Information  Last PT Received On: 03/02/13 Assistance Needed: +1 History of Present Illness: Patient is a 57 yo male s/p Lt TKA.    Subjective Data  Subjective: in pain Patient Stated Goal: Home soon   Cognition  Cognition Arousal/Alertness: Awake/alert Behavior During Therapy: WFL for tasks assessed/performed Overall Cognitive Status: Within Functional Limits for tasks assessed    Balance     End of Session PT - End of Session Equipment Utilized During Treatment: Gait belt Activity Tolerance: Patient limited by pain Patient left: in chair;with call bell/phone within reach;with family/visitor present Nurse Communication: Mobility status;Patient requests pain meds    GP     Van Clines [facility] [place], [place] [phone]  03/02/2013, 10:37 AM

## 2013-03-02 NOTE — Progress Notes (Signed)
SPORTS MEDICINE AND JOINT REPLACEMENT  Georgena Spurling, MD   Altamese Cabal, PA-C [address] [place], [place], [place]  [address]                             [phone]   PROGRESS NOTE  Subjective:  negative for Chest Pain  negative for Shortness of Breath  negative for Nausea/Vomiting   negative for Calf Pain  negative for Bowel Movement   Tolerating Diet: yes         Patient reports pain as 5 on 0-10 scale.    Objective: Vital signs in last 24 hours:   Patient Vitals for the past 24 hrs:  BP Temp Temp src Pulse Resp SpO2 Height Weight  03/02/13 0600 142/71 mmHg 98.3 F (36.8 C) - 120 18 94 % - -  03/02/13 0200 146/68 mmHg 98.3 F (36.8 C) - 117 18 92 % - -  03/01/13 2044 148/74 mmHg 97.8 F (36.6 C) - 122 18 95 % - -  03/01/13 1843 160/89 mmHg 98.9 F (37.2 C) - 118 18 96 % - -  03/01/13 1546 - - - - 18 99 % - -  03/01/13 1200 164/67 mmHg 97.7 F (36.5 C) Oral 118 18 99 % 6\' 1"  (1.854 m) 144.244 kg (318 lb)  03/01/13 1130 165/91 mmHg 98.3 F (36.8 C) - 114 16 98 % - -  03/01/13 1115 150/73 mmHg - - 110 12 98 % - -  03/01/13 1100 162/73 mmHg - - 112 14 100 % - -  03/01/13 1045 149/80 mmHg - - 105 10 99 % - -  03/01/13 1030 143/80 mmHg - - 107 14 100 % - -  03/01/13 1009 138/60 mmHg 98.4 F (36.9 C) - 112 15 100 % - -    @flow {1959:LAST@   Intake/Output from previous day:   11/17 0701 - 11/18 0700 In: 2540 [P.O.:240; I.V.:2300] Out: 1950 [Urine:900; Drains:950]   Intake/Output this shift:       Intake/Output     11/17 0701 - 11/18 0700 11/18 0701 - 11/19 0700   P.O. 240    I.V. (mL/kg) 2300 (15.9)    Total Intake(mL/kg) 2540 (17.6)    Urine (mL/kg/hr) 900 (0.3)    Drains 950 (0.3)    Blood 100 (0)    Total Output 1950     Net +590             LABORATORY DATA:  Recent Labs  03/01/13 1230  WBC 37.8*  HGB 13.7  HCT 41.5  PLT 173    Recent Labs  03/01/13 1230  CREATININE 0.80   Lab Results  Component Value Date   INR 0.91 02/22/2013     Examination:  General appearance: alert, cooperative and no distress Extremities: Homans sign is negative, no sign of DVT  Wound Exam: clean, dry, intact   Drainage:  None: wound tissue dry  Motor Exam: EHL and FHL Intact  Sensory Exam: Deep Peroneal normal   Assessment:    1 Day Post-Op  Procedure(s) (LRB): TOTAL KNEE ARTHROPLASTY (Left)  ADDITIONAL DIAGNOSIS:  Active Problems:   * No active hospital problems. *  Acute Blood Loss Anemia   Plan: Physical Therapy as ordered Weight Bearing as Tolerated (WBAT)  DVT Prophylaxis:  Lovenox  DISCHARGE PLAN: Home  DISCHARGE NEEDS: HHPT, CPM, Walker and 3-in-1 comode seat         Lenay Lovejoy 03/02/2013, 7:31 AM

## 2013-03-02 NOTE — Evaluation (Signed)
Occupational Therapy Evaluation Patient Details Name: Sean Parks MRN: [id number] DOB: 1955/08/15 Today's Date: 03/02/2013 Time: 4098-1191 OT Time Calculation (min): 28 min  OT Assessment / Plan / Recommendation History of present illness Patient is a 57 yo male s/p Lt TKA.   Clinical Impression   Provided education to patient and spouse. Pt moving well during evaluation. Feel pt is safe to d/c home with 24/7 supervision/assistance.     OT Assessment  Patient does not need any further OT services    Follow Up Recommendations  No OT follow up;Supervision/Assistance - 24 hour    Barriers to Discharge      Equipment Recommendations  None recommended by OT    Recommendations for Other Services    Frequency       Precautions / Restrictions Precautions Precautions: Knee Precaution Booklet Issued: No Precaution Comments: Reviewed precautions with patient and wife. Restrictions Weight Bearing Restrictions: Yes LLE Weight Bearing: Weight bearing as tolerated   Pertinent Vitals/Pain Pain 3/10. Increased activity during session.     ADL  Lower Body Dressing: Moderate assistance Where Assessed - Lower Body Dressing: Supported sit to Pharmacist, hospital: Minimal assistance Toilet Transfer Method: Sit to stand (stand to sit) Acupuncturist: Other (comment) Administrator, [employer]) Tub/Shower Transfer: Simulated;Minimal assistance Tub/Shower Transfer Method: Science writer: Walk in shower Equipment Used: Gait belt;Long-handled shoe horn;Long-handled sponge;Reacher;Rolling walker;Sock aid Transfers/Ambulation Related to ADLs: Min guard/Min A for sit <> stand transfers and Min A for shower transfer. Min guard for ambulation. ADL Comments: Practiced simulated shower transfer and assistance for balance. Cues and educated on technique. Educated to sit to bathe and also to practice shower transfer with [facility] therapist before doing it with wife at home.  Educated on dressing technique and educated on AE available for LB ADLs.  Educated on benefit of bending down to don/doff left sock as it increases ROM in knee.     OT Diagnosis:    OT Problem List:   OT Treatment Interventions:     OT Goals(Current goals can be found in the care plan section) Acute Rehab OT Goals Patient Stated Goal: Home soon  Visit Information  Last OT Received On: 03/02/13 Assistance Needed: +1 History of Present Illness: Patient is a 57 yo male s/p Lt TKA.       Prior Functioning     Home Living Family/patient expects to be discharged to:: Private residence Living Arrangements: Spouse/significant other Available Help at Discharge: Family;Available 24 hours/day Type of Home: House Home Access: Stairs to enter [employer] of Steps: 1 Entrance Stairs-Rails: None Home Layout: One level Home Equipment: Walker - 2 wheels;Bedside commode Prior Function Level of Independence: Independent Communication Communication: No difficulties         Vision/Perception     Cognition  Cognition Arousal/Alertness: Awake/alert Behavior During Therapy: WFL for tasks assessed/performed Overall Cognitive Status: Within Functional Limits for tasks assessed    Extremity/Trunk Assessment Upper Extremity Assessment Upper Extremity Assessment: Overall WFL for tasks assessed Lower Extremity Assessment Lower Extremity Assessment: Defer to PT evaluation     Mobility Bed Mobility Bed Mobility: Supine to Sit Supine to Sit: 4: Min assist Details for Bed Mobility Assistance: Assisted with trunk and minimal assist with LE.  Transfers Transfers: Sit to Stand;Stand to Sit Sit to Stand: From elevated surface;With upper extremity assist;From bed;4: Min guard Stand to Sit: With upper extremity assist;With armrests;To chair/3-in-1;4: Min guard Details for Transfer Assistance: Cues to control descent to chair and for technique.  Balance     End of Session  OT - End of Session Equipment Utilized During Treatment: Rolling walker;Gait belt Activity Tolerance: Patient tolerated treatment well Patient left: in chair;with family/visitor present;Other (comment) (with PT)  GO     Earlie Raveling OTR/L [phone] 03/02/2013, 12:32 PM

## 2013-03-02 NOTE — Op Note (Signed)
TOTAL KNEE REPLACEMENT OPERATIVE NOTE:  03/01/2013  12:32 PM  PATIENT:  Sean Parks  57 y.o. male  PRE-OPERATIVE DIAGNOSIS:  osteoarthritis left knee  POST-OPERATIVE DIAGNOSIS:  osteoarthritis left knee  PROCEDURE:  Procedure(s): TOTAL KNEE ARTHROPLASTY  SURGEON:  Surgeon(s): Dannielle Huh, MD  PHYSICIAN ASSISTANT: Altamese Cabal, [facility]  ANESTHESIA:   general  DRAINS: Hemovac  SPECIMEN: None  COUNTS:  Correct  TOURNIQUET:   Total Tourniquet Time Documented: Thigh (Left) - 87 minutes Total: Thigh (Left) - 87 minutes   DICTATION:  Indication for procedure:    The patient is a 57 y.o. male who has failed conservative treatment for osteoarthritis left knee.  Informed consent was obtained prior to anesthesia. The risks versus benefits of the operation were explain and in a way the patient can, and did, understand.   On the implant demand matching protocol, this patient scored 16.  Therefore, this patient was receive a polyethylene insert with vitamin E which is a high demand implant.  Description of procedure:     The patient was taken to the operating room and placed under anesthesia.  The patient was positioned in the usual fashion taking care that all body parts were adequately padded and/or protected.  I foley catheter was not placed.  A tourniquet was applied and the leg prepped and draped in the usual sterile fashion.  The extremity was exsanguinated with the esmarch and tourniquet inflated to 350 mmHg.  Pre-operative range of motion was 5-95 degrees flexion.  The knee was in 5 degree of mild varus.  A midline incision approximately 6-7 inches long was made with a #10 blade.  A new blade was used to make a parapatellar arthrotomy going 2-3 cm into the quadriceps tendon, over the patella, and alongside the medial aspect of the patellar tendon.  A synovectomy was then performed with the #10 blade and forceps. I then elevated the deep MCL off the medial tibial metaphysis  subperiosteally around to the semimembranosus attachment.    I everted the patella and used calipers to measure patellar thickness.  I used the reamer to ream down to appropriate thickness to recreate the native thickness.  I then removed excess bone with the rongeur and sagittal saw.  I used the appropriately sized template and drilled the three lug holes.  I then put the trial in place and measured the thickness with the calipers to ensure recreation of the native thickness.  The trial was then removed and the patella subluxed and the knee brought into flexion.  A homan retractor was place to retract and protect the patella and lateral structures.  A Z-retractor was place medially to protect the medial structures.  The extra-medullary alignment system was used to make cut the tibial articular surface perpendicular to the anamotic axis of the tibia and in 3 degrees of posterior slope.  The cut surface and alignment jig was removed.  I then used the intramedullary alignment guide to make a 6 valgus cut on the distal femur.  I then marked out the epicondylar axis on the distal femur.  The posterior condylar axis measured 3 degrees.  I then used the anterior referencing sizer and measured the femur to be a size 11.  The 4-In-1 cutting block was screwed into place in external rotation matching the posterior condylar angle, making our cuts perpendicular to the epicondylar axis.  Anterior, posterior and chamfer cuts were made with the sagittal saw.  The cutting block and cut pieces were removed.  A  lamina spreader was placed in 90 degrees of flexion.  The ACL, PCL, menisci, and posterior condylar osteophytes were removed.  A 9 mm spacer blocked was found to offer good flexion and extension gap balance after minimal in degree releasing.   The scoop retractor was then placed and the femoral finishing block was pinned in place.  The small sagittal saw was used as well as the lug drill to finish the femur.  The block  and cut surfaces were removed and the medullary canal hole filled with autograft bone from the cut pieces.  The tibia was delivered forward in deep flexion and external rotation.  A size F tray was selected and pinned into place centered on the medial 1/3 of the tibial tubercle.  The reamer and keel was used to prepare the tibia through the tray.    I then trialed with the size 11 femur, size F tibia, a 9 mm insert and the 35 patella.  I had excellent flexion/extension gap balance, excellent patella tracking.  Flexion was full and beyond 120 degrees; extension was zero.  These components were chosen and the staff opened them to me on the back table while the knee was lavaged copiously and the cement mixed.  The soft tissue was infiltrated with 60cc of exparel 1.3% through a 21 gauge needle.  I cemented in the components and removed all excess cement.  The polyethylene tibial component was snapped into place and the knee placed in extension while cement was hardening.  The capsule was infilltrated with 30cc of .25% Marcaine with epinephrine.  A hemovac was place in the joint exiting superolaterally.  A pain pump was place superomedially superficial to the arthrotomy.  Once the cement was hard, the tourniquet was let down.  Hemostasis was obtained.  The arthrotomy was closed with figure-8 #1 vicryl sutures.  The deep soft tissues were closed with #0 vicryls and the subcuticular layer closed with a running #2-0 vicryl.  The skin was reapproximated and closed with skin staples.  The wound was dressed with xeroform, 4 x4's, 2 ABD sponges, a single layer of webril and a TED stocking.   The patient was then awakened, extubated, and taken to the recovery room in stable condition.  BLOOD LOSS:  300cc DRAINS: 1 hemovac, 1 pain catheter COMPLICATIONS:  None.  PLAN OF CARE: Admit to inpatient   PATIENT DISPOSITION:  PACU - hemodynamically stable.   Delay start of Pharmacological VTE agent (>24hrs) due to  surgical blood loss or risk of bleeding:  not applicable  Please fax a copy of this op note to my office at [phone] (please only include page 1 and 2 of the Case Information op note)

## 2013-03-03 ENCOUNTER — Encounter (HOSPITAL_COMMUNITY): Payer: Self-pay | Admitting: Orthopedic Surgery

## 2013-03-09 NOTE — Discharge Summary (Signed)
SPORTS MEDICINE & JOINT REPLACEMENT   Georgena Spurling, MD   Altamese Cabal, PA-C [address] [place], [place], [place]  [address]                             [phone]  PATIENT ID: Sean Parks        MRN:  [id number]          DOB/AGE: 1956-03-12 / 57 y.o.    DISCHARGE SUMMARY  ADMISSION DATE:    03/01/2013 DISCHARGE DATE:  03/02/2013  ADMISSION DIAGNOSIS: osteoarthritis left knee    DISCHARGE DIAGNOSIS:  osteoarthritis left knee    ADDITIONAL DIAGNOSIS: Active Problems:   * No active hospital problems. *  Past Medical History  Diagnosis Date  . 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 Sleep apnea     Wears CPAP    PROCEDURE: Procedure(s): TOTAL KNEE ARTHROPLASTY on 03/01/2013  CONSULTS:     HISTORY:  See H&P in chart  HOSPITAL COURSE:  Sean Parks is a 57 y.o. admitted on 03/01/2013 and found to have a diagnosis of osteoarthritis left knee.  After appropriate laboratory studies were obtained  they were taken to the operating room on 03/01/2013 and underwent Procedure(s): TOTAL KNEE ARTHROPLASTY.   They were given perioperative antibiotics:  Anti-infectives   Start     Dose/Rate Route Frequency Ordered Stop   03/01/13 1400  ceFAZolin (ANCEF) IVPB 2 g/50 mL premix     2 g 100 mL/hr over 30 Minutes Intravenous Every 6 hours 03/01/13 1153 03/01/13 2045   03/01/13 1153  minocycline (MINOCIN,DYNACIN) capsule 50 mg  Status:  Discontinued     50 mg Oral Daily PRN 03/01/13 1153 03/02/13 2015   02/28/13 1048  ceFAZolin (ANCEF) 3 g in dextrose 5 % 50 mL IVPB  Status:  Discontinued     3 g 160 mL/hr over 30 Minutes Intravenous On call to O.R. 02/28/13 1048 03/01/13 1154    .  Tolerated the procedure well.  Placed with a foley intraoperatively.  Given Ofirmev at induction and for 48 hours.    POD# 1: Vital signs were stable.  Patient denied Chest pain, shortness of breath, or calf pain.  Patient was started on Lovenox 30 mg subcutaneously  twice daily at 8am.  Consults to PT, OT, and care management were made.  The patient was weight bearing as tolerated.  CPM was placed on the operative leg 0-90 degrees for 6-8 hours a day.  Incentive spirometry was taught.  Dressing was changed.  Marcaine pump and hemovac were discontinued.      POD #2, Continued  PT for ambulation and exercise program.  IV saline locked.  O2 discontinued.    The remainder of the hospital course was dedicated to ambulation and strengthening.   The patient was discharged on 1 day post op in  Good condition.  Blood products given:none  DIAGNOSTIC STUDIES: Recent vital signs: No data found.      Recent laboratory studies: No results found for this basename: WBC, HGB, HCT, PLT,  in the last 168 hours No results found for this basename: NA, K, CL, CO2, BUN, CREATININE, GLUCOSE, CALCIUM,  in the last 168 hours Lab Results  Component Value Date   INR 0.91 02/22/2013     Recent Radiographic Studies :  Dg Chest 2 View  02/22/2013   CLINICAL DATA:  Pre admit left knee replacement  EXAM:  CHEST  2 VIEW  COMPARISON:  None.  FINDINGS: Normal mediastinum and cardiac silhouette. Normal pulmonary vasculature. No evidence of effusion, infiltrate, or pneumothorax. No acute bony abnormality. Degenerative osteophytosis of the thoracic spine.  IMPRESSION: No acute cardiopulmonary process.   Electronically Signed   By: Genevive Bi M.D.   On: 02/22/2013 18:09    DISCHARGE INSTRUCTIONS: Discharge Orders   Future Orders Complete By Expires   Call MD / Call 911  As directed    Comments:     If you experience chest pain or shortness of breath, CALL 911 and be transported to the hospital emergency room.  If you develope a fever above 101 F, pus (white drainage) or increased drainage or redness at the wound, or calf pain, call your surgeon's office.   Change dressing  As directed    Comments:     Change dressing on wednesday, then change the dressing daily with sterile 4  x 4 inch gauze dressing and apply TED hose.   Constipation Prevention  As directed    Comments:     Drink plenty of fluids.  Prune juice may be helpful.  You may use a stool softener, such as Colace (over the counter) 100 mg twice a day.  Use MiraLax (over the counter) for constipation as needed.   CPM  As directed    Comments:     Continuous passive motion machine (CPM):      Use the CPM from 0 to 90 for 6-8 hours per day.      You may increase by 10 per day.  You may break it up into 2 or 3 sessions per day.      Use CPM for 2 weeks or until you are told to stop.   Diet - low sodium heart healthy  As directed    Do not put a pillow under the knee. Place it under the heel.  As directed    Driving restrictions  As directed    Comments:     No driving for 6 weeks   Increase activity slowly as tolerated  As directed    Lifting restrictions  As directed    Comments:     No lifting for 6 weeks   TED hose  As directed    Comments:     Use stockings (TED hose) for 3 weeks on both leg(s).  You may remove them at night for sleeping.      DISCHARGE MEDICATIONS:     Medication List    STOP taking these medications       traMADol 50 MG tablet  Commonly known as:  ULTRAM      TAKE these medications       acetaminophen 500 MG tablet  Commonly known as:  TYLENOL  Take 1,000 mg by mouth every 6 (six) hours as needed for mild pain.     cetirizine-pseudoephedrine 5-120 MG per tablet  Commonly known as:  ZYRTEC-D  Take 1 tablet by mouth daily as needed for allergies.     diphenhydramine-acetaminophen 25-500 MG Tabs  Commonly known as:  TYLENOL PM  Take 2 tablets by mouth at bedtime as needed.     enoxaparin 40 MG/0.4ML injection  Commonly known as:  LOVENOX  Inject 0.4 mLs (40 mg total) into the skin daily.     HYDROmorphone HCl 8 MG T24a SR tablet  Commonly known as:  EXALGO  Take 1 tablet (8 mg total) by mouth daily.  methocarbamol 500 MG tablet  Commonly known as:   ROBAXIN  Take 1-2 tablets (500-1,000 mg total) by mouth every 6 (six) hours as needed for muscle spasms.     minocycline 50 MG capsule  Commonly known as:  MINOCIN,DYNACIN  Take 50 mg by mouth daily as needed (flare up).     oxyCODONE 5 MG immediate release tablet  Commonly known as:  Oxy IR/ROXICODONE  Take 1-2 tablets (5-10 mg total) by mouth every 3 (three) hours as needed for breakthrough pain.        FOLLOW UP VISIT:       Follow-up Information   Follow up with Raymon Mutton, MD. Call on 03/16/2013.   Specialty:  Orthopedic Surgery   Contact information:   [address] [place] [address] [phone]       DISPOSITION: HOME   CONDITION:  Good   Sean Parks 03/09/2013, 10:46 AM

## 2014-03-30 ENCOUNTER — Other Ambulatory Visit: Payer: Self-pay | Admitting: Orthopedic Surgery

## 2014-05-19 ENCOUNTER — Other Ambulatory Visit (HOSPITAL_COMMUNITY): Payer: Self-pay

## 2014-05-30 ENCOUNTER — Encounter (HOSPITAL_COMMUNITY): Payer: Self-pay

## 2014-05-30 ENCOUNTER — Inpatient Hospital Stay (HOSPITAL_COMMUNITY): Admit: 2014-05-30 | Payer: BC Managed Care – PPO | Admitting: Orthopedic Surgery

## 2014-05-30 SURGERY — ARTHROPLASTY, KNEE, TOTAL
Anesthesia: Regional | Laterality: Right

## 2015-07-12 IMAGING — CR DG CHEST 2V
2 series · 2 of 2 positions shown · non-contrast
Comparison: None.

CLINICAL DATA: Pre admit left knee replacement

EXAM:
CHEST  2 VIEW

[w chest pa]
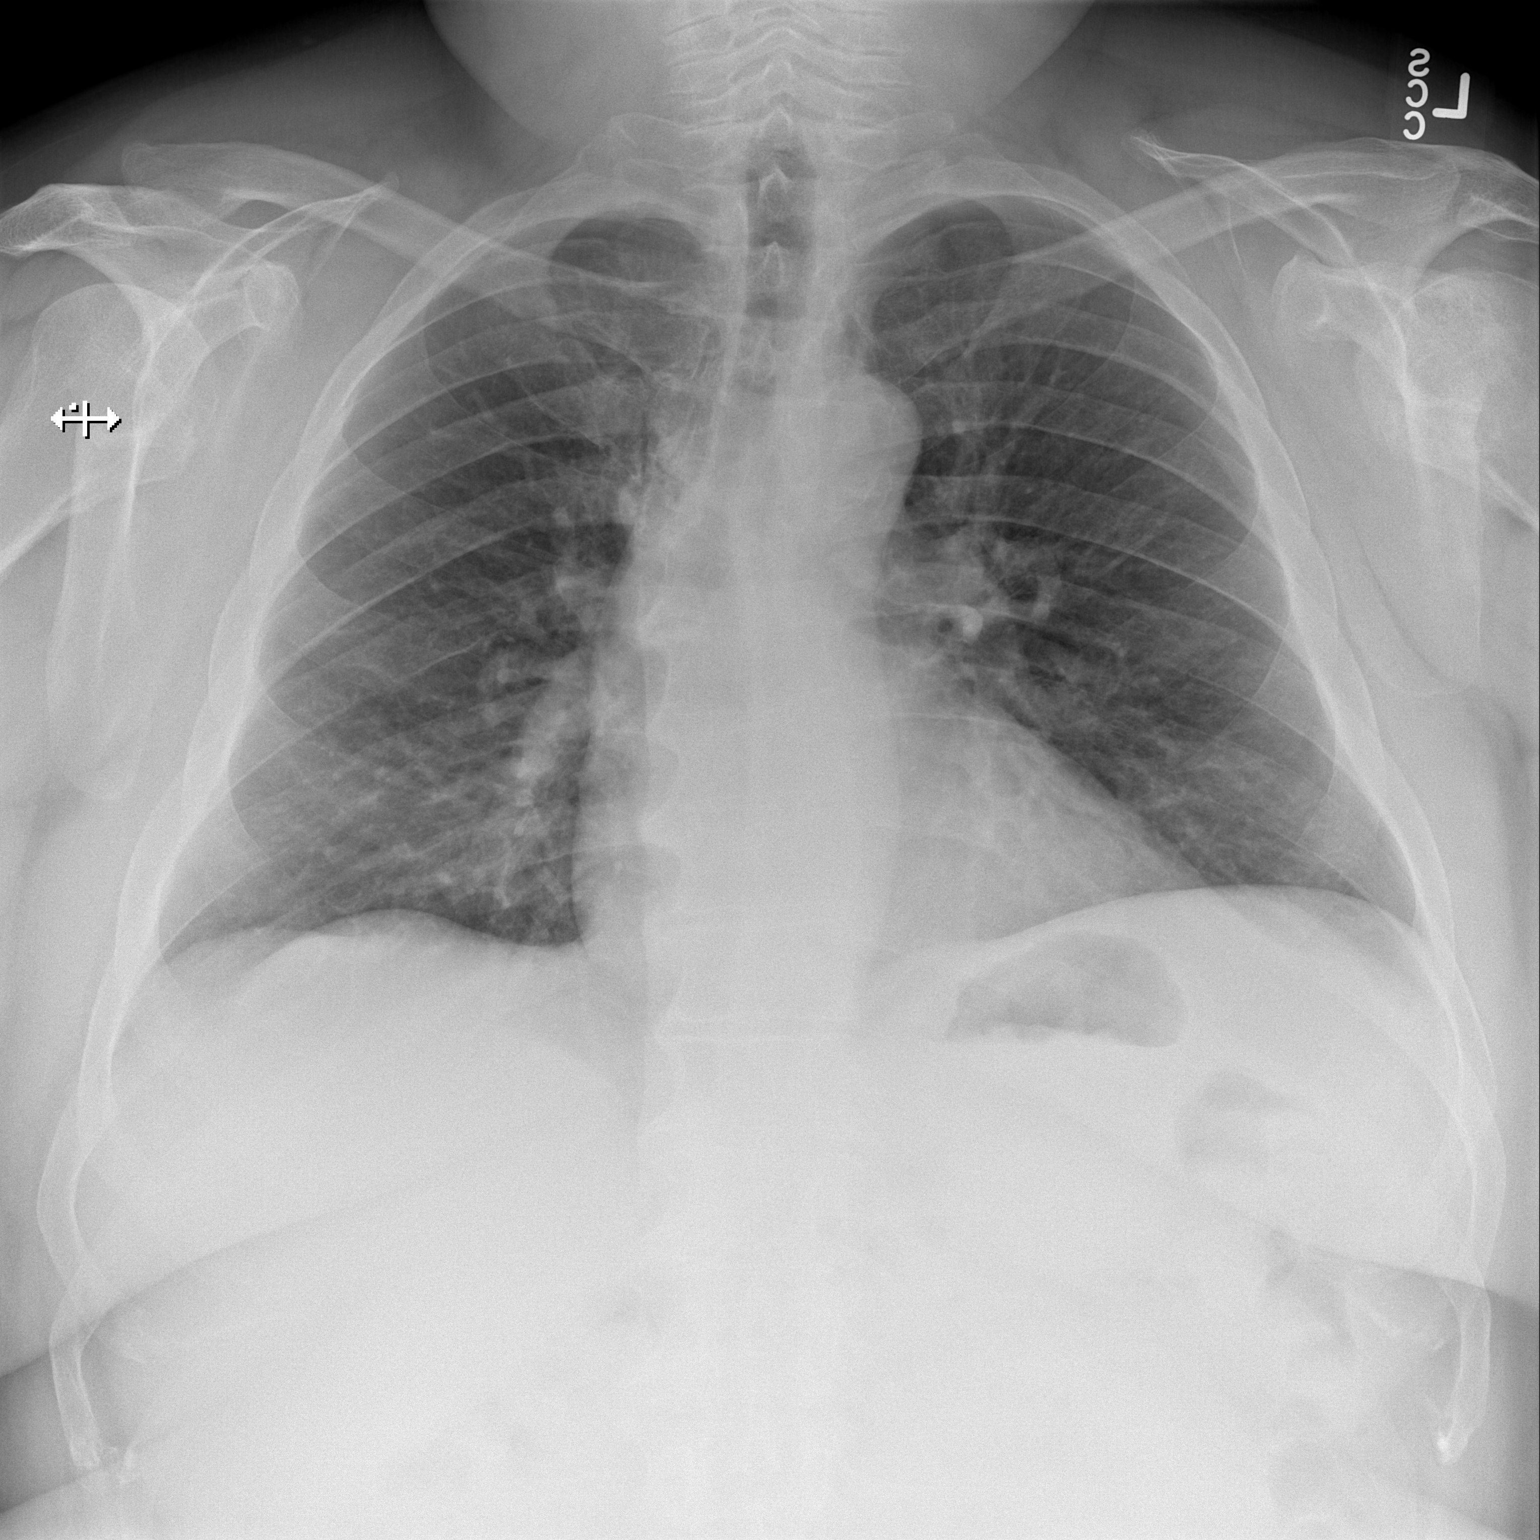

[w chest lat]
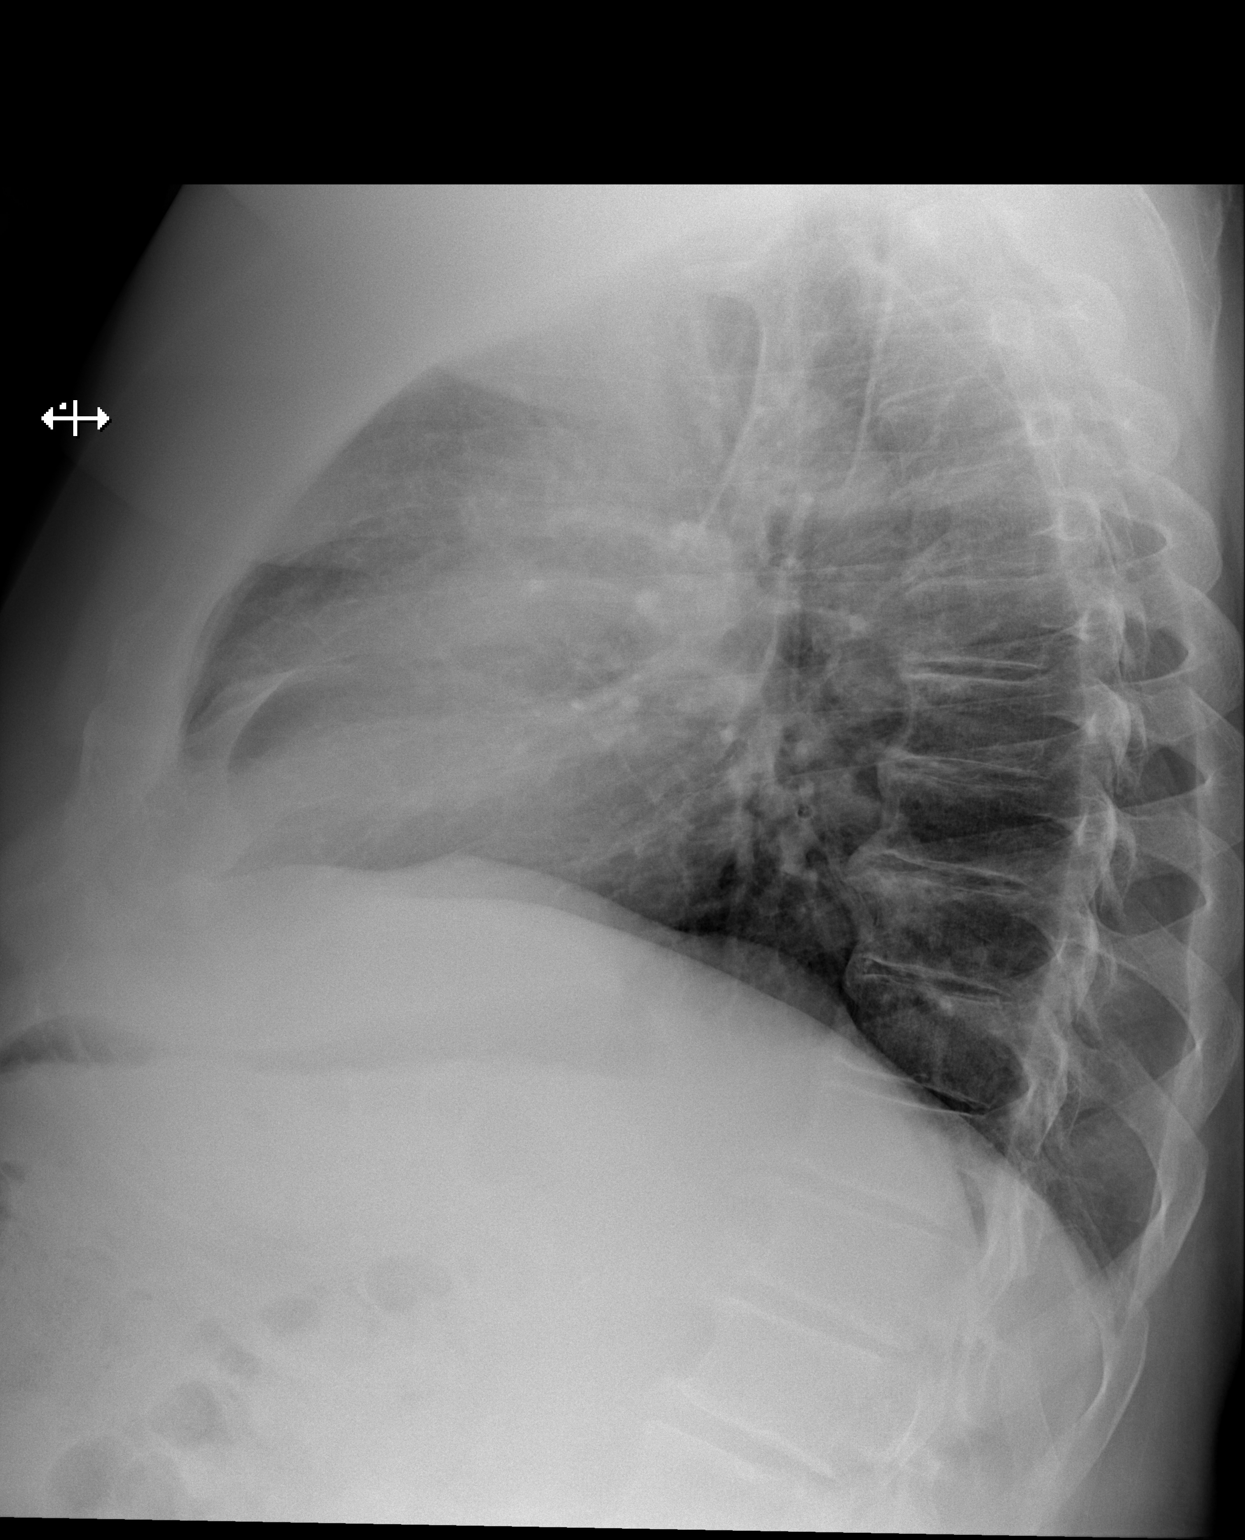

[2 of 2 positions shown; findings below may reference images not displayed]

FINDINGS: Normal mediastinum and cardiac silhouette. Normal pulmonary
vasculature. No evidence of effusion, infiltrate, or pneumothorax.
No acute bony abnormality. Degenerative osteophytosis of the
thoracic spine.
IMPRESSION: No acute cardiopulmonary process.

## 2022-04-05 NOTE — Progress Notes (Signed)
Surgery orders requested via Epic inbox. °

## 2022-04-09 NOTE — Progress Notes (Addendum)
Anesthesia Review:  PCP: Zimmer- preop clearance note in care everywhere dated  02/25/22  Cardiologist : none  Chest x-ray : EKG : Echo : 03/01/22  Stress test: 09/18/21  Cardiac Cath :  Activity level: can do a flight of stairs without difficutly  Sleep Study/ CPAP : has cpap  Fasting Blood Sugar :      / Checks Blood Sugar -- times a day:   Blood Thinner/ Instructions /Last Dose: ASA / Instructions/ Last Dose :    Rerequested orders on 04/12/22    PT was 40 minutes late for preop appt.   Rerequested orders on 04/12/22.   CBC done 04/12/22 routed to DR Lucey.  Shawn Stall made aware.

## 2022-04-10 NOTE — Patient Instructions (Addendum)
SURGICAL WAITING ROOM VISITATION  Patients having surgery or a procedure may have no more than 2 support people in the waiting area - these visitors may rotate.    Children under the age of 45 must have an adult with them who is not the patient.  Due to an increase in RSV and influenza rates and associated hospitalizations, children ages 86 and under may not visit patients in [place].  If the patient needs to stay at the hospital during part of their recovery, the visitor guidelines for inpatient rooms apply. Pre-op nurse will coordinate an appropriate time for 1 support person to accompany patient in pre-op.  This support person may not rotate.    Please refer to the [facility] website for the visitor guidelines for Inpatients (after your surgery is over and you are in a regular room).       Your procedure is scheduled on:  04/19/2022    Report to [facility] Main Entrance    Report to admitting at   [phone]   Call this number if you have problems the morning of surgery [phone]   Do not eat food  or drink :After Midnight.              If you have questions, please contact your surgeon's office.       Oral Hygiene is also important to reduce your risk of infection.                                    Remember - BRUSH YOUR TEETH THE MORNING OF SURGERY WITH YOUR REGULAR TOOTHPASTE  DENTURES WILL BE REMOVED PRIOR TO SURGERY PLEASE DO NOT APPLY "Poly grip" OR ADHESIVES!!!   Do NOT smoke after Midnight   Take these medicines the morning of surgery with A SIP OF WATER:  none   DO NOT TAKE ANY ORAL DIABETIC MEDICATIONS DAY OF YOUR SURGERY  Bring CPAP mask and tubing day of surgery.                              You may not have any metal on your body including hair pins, jewelry, and body piercing             Do not wear make-up, lotions, powders, perfumes/cologne, or deodorant  Do not wear nail polish including gel and S&S, artificial/acrylic  nails, or any other type of covering on natural nails including finger and toenails. If you have artificial nails, gel coating, etc. that needs to be removed by a nail salon please have this removed prior to surgery or surgery may need to be canceled/ delayed if the surgeon/ anesthesia feels like they are unable to be safely monitored.   Do not shave  48 hours prior to surgery.               Men may shave face and neck.   Do not bring valuables to the hospital. [place].   Contacts, glasses, dentures or bridgework may not be worn into surgery.   Bring small overnight bag day of surgery.   DO NOT [place]. PHARMACY WILL DISPENSE MEDICATIONS LISTED ON YOUR MEDICATION LIST TO YOU DURING YOUR ADMISSION [place]!  Patients discharged on the day of surgery will not be allowed to drive home.  Someone NEEDS to stay with you for the first 24 hours after anesthesia.   Special Instructions: Bring a copy of your healthcare power of attorney and living will documents the day of surgery if you haven't scanned them before.              Please read over the following fact sheets you were given: IF [place] [phone]   If you received a COVID test during your pre-op visit  it is requested that you wear a mask when out in public, stay away from anyone that may not be feeling well and notify your surgeon if you develop symptoms. If you test positive for Covid or have been in contact with anyone that has tested positive in the last 10 days please notify you surgeon.    [place] - Preparing for Surgery Before surgery, you can play an important role.  Because skin is not sterile, your skin needs to be as free of germs as possible.  You can reduce the number of germs on your skin by washing with CHG (chlorahexidine gluconate) soap before surgery.  CHG is an  antiseptic cleaner which kills germs and bonds with the skin to continue killing germs even after washing. Please DO NOT use if you have an allergy to CHG or antibacterial soaps.  If your skin becomes reddened/irritated stop using the CHG and inform your nurse when you arrive at Short Stay. Do not shave (including legs and underarms) for at least 48 hours prior to the first CHG shower.  You may shave your face/neck. Please follow these instructions carefully:  1.  Shower with CHG Soap the night before surgery and the  morning of Surgery.  2.  If you choose to wash your hair, wash your hair first as usual with your  normal  shampoo.  3.  After you shampoo, rinse your hair and body thoroughly to remove the  shampoo.                           4.  Use CHG as you would any other liquid soap.  You can apply chg directly  to the skin and wash                       Gently with a scrungie or clean washcloth.  5.  Apply the CHG Soap to your body ONLY FROM THE NECK DOWN.   Do not use on face/ open                           Wound or open sores. Avoid contact with eyes, ears mouth and genitals (private parts).                       Wash face,  Genitals (private parts) with your normal soap.             6.  Wash thoroughly, paying special attention to the area where your surgery  will be performed.  7.  Thoroughly rinse your body with warm water from the neck down.  8.  DO NOT shower/wash with your normal soap after using and rinsing off  the CHG Soap.  9.  Pat yourself dry with a clean towel.            10.  Wear clean pajamas.            11.  Place clean sheets on your bed the night of your first shower and do not  sleep with pets. Day of Surgery : Do not apply any lotions/deodorants the morning of surgery.  Please wear clean clothes to the hospital/surgery center.  FAILURE TO FOLLOW THESE INSTRUCTIONS MAY RESULT IN THE CANCELLATION OF YOUR SURGERY PATIENT  SIGNATURE_________________________________  NURSE SIGNATURE__________________________________  ________________________________________________________________________

## 2022-04-12 ENCOUNTER — Encounter (HOSPITAL_COMMUNITY): Payer: Self-pay | Admitting: Orthopedic Surgery

## 2022-04-12 ENCOUNTER — Other Ambulatory Visit: Payer: Self-pay

## 2022-04-12 ENCOUNTER — Encounter (HOSPITAL_COMMUNITY)
Admission: RE | Admit: 2022-04-12 | Discharge: 2022-04-12 | Disposition: A | Discharge status: Home / Self Care | Payer: Medicare Other | Source: Ambulatory Visit | Attending: Orthopedic Surgery | Admitting: Orthopedic Surgery

## 2022-04-12 VITALS — BP 149/79 | HR 81 | Temp 97.5°F | Resp 16 | Ht 71.0 in | Wt 281.0 lb

## 2022-04-12 DIAGNOSIS — M1711 Unilateral primary osteoarthritis, right knee: Secondary | ICD-10-CM | POA: Insufficient documentation

## 2022-04-12 DIAGNOSIS — G4730 Sleep apnea, unspecified: Secondary | ICD-10-CM | POA: Insufficient documentation

## 2022-04-12 DIAGNOSIS — Z01812 Encounter for preprocedural laboratory examination: Secondary | ICD-10-CM | POA: Insufficient documentation

## 2022-04-12 DIAGNOSIS — Z01818 Encounter for other preprocedural examination: Secondary | ICD-10-CM

## 2022-04-12 LAB — CBC
HCT: 48.2 % (ref 39.0–52.0)
Hemoglobin: 15.4 g/dL (ref 13.0–17.0)
MCH: 28.7 pg (ref 26.0–34.0)
MCHC: 32 g/dL (ref 30.0–36.0)
MCV: 89.9 fL (ref 80.0–100.0)
Platelets: 176 10*3/uL (ref 150–400)
RBC: 5.36 MIL/uL (ref 4.22–5.81)
RDW: 13.7 % (ref 11.5–15.5)
WBC: 15.1 10*3/uL — ABNORMAL HIGH (ref 4.0–10.5)
nRBC: 0 % (ref 0.0–0.2)

## 2022-04-12 LAB — SURGICAL PCR SCREEN
MRSA, PCR: NEGATIVE
Staphylococcus aureus: NEGATIVE

## 2022-04-16 NOTE — Progress Notes (Signed)
Anesthesia Chart Review   Case: [id number] Date/Time: 04/19/22 0715   Procedure: TOTAL KNEE ARTHROPLASTY (Right: Knee)   Anesthesia type: Spinal   Pre-op diagnosis: Osteoarthritis of right knee M17.11   Location: [facility] ROOM 07 / [facility]   Surgeons: Vickey Huger, MD       DISCUSSION:66 y.o. never smoker with h/o sleep apnea with CPAP, CLL, right knee OA scheduled for above procedure 04/19/2022 with Dr. Vickey Huger.   Pt seen by PCP for evaluation of heart murmur/preoperative evaluation.  Per OV note echo was ordered for further evaluation.  Pt denies shortness of breath, orthopnea, PND.   Echo 03/01/2022 (in [place]) with EF 60-65%, mild to moderate aortic regurgitation, moderate aortic stenosis. Mean gradient 38.5 mmHg, AVA 0.74 cm2.   VM left with Dr. Ruel Favors office to see if clearance had been received.   VS: BP (!) 149/79   Pulse 81   Temp (!) 36.4 C (Oral)   Resp 16   Ht '5\' 11"'$  (1.803 m)   Wt 127.5 kg   SpO2 98%   BMI 39.19 kg/m   PROVIDERS: Olena Mater, MD is PCP    LABS: Labs reviewed: Acceptable for surgery. 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   Heart murmur    Sleep apnea    Wears CPAP    Past Surgical History:  Procedure Laterality Date   bone spurs   04/15/2010   Removed from L shoulder   COLONOSCOPY W/ BIOPSIES     FRACTURE SURGERY     L foot when pt was 67 years old   LYMPH NODE BIOPSY     right hip repolacement      TOTAL KNEE ARTHROPLASTY Left 03/01/2013   Procedure: TOTAL KNEE ARTHROPLASTY;  Surgeon: Vickey Huger, MD;  Location: [facility];  Service: Orthopedics;  Laterality:  Left;    MEDICATIONS:  cetirizine-pseudoephedrine (ZYRTEC-D) 5-120 MG per tablet   ibuprofen (ADVIL) 200 MG tablet   minocycline (DYNACIN) 100 MG tablet   PRESCRIPTION MEDICATION   tamsulosin (FLOMAX) 0.4 MG CAPS capsule   traMADol (ULTRAM) 50 MG tablet   No current facility-administered medications for this encounter.     Konrad Felix Ward, PA-C [facility] Pre-Surgical Testing [phone]

## 2022-04-18 ENCOUNTER — Other Ambulatory Visit: Payer: Self-pay | Admitting: Orthopedic Surgery

## 2022-04-18 DIAGNOSIS — G8929 Other chronic pain: Secondary | ICD-10-CM

## 2022-04-18 NOTE — Anesthesia Preprocedure Evaluation (Addendum)
Anesthesia Evaluation  Patient identified by MRN, date of birth, ID band Patient awake    Reviewed: Allergy & Precautions, NPO status , Patient's Chart, lab work & pertinent test results  Airway Mallampati: III  TM Distance: >3 FB Neck ROM: Full    Dental  (+) Dental Advisory Given   Pulmonary sleep apnea and Continuous Positive Airway Pressure Ventilation    breath sounds clear to auscultation       Cardiovascular + Valvular Problems/Murmurs AI and AS  Rhythm:Regular Rate:Normal  Echo 03/01/2022 (in [place]) with EF 60-65%, mild to moderate aortic regurgitation, moderate aortic stenosis. Mean gradient 38.5 mmHg, AVA 0.74 cm2.    Neuro/Psych negative neurological ROS     GI/Hepatic negative GI ROS, Neg liver ROS,,,  Endo/Other  negative endocrine ROS    Renal/GU negative Renal ROS     Musculoskeletal  (+) Arthritis ,    Abdominal   Peds  Hematology negative hematology ROS (+) CLL    Anesthesia Other Findings   Reproductive/Obstetrics                              Lab Results  Component Value Date   WBC 13.1 (H) 04/19/2022   HGB 14.8 04/19/2022   HCT 46.1 04/19/2022   MCV 88.3 04/19/2022   PLT 162 04/19/2022    Anesthesia Physical Anesthesia Plan  ASA: 3  Anesthesia Plan: Spinal   Post-op Pain Management: Regional block*, Tylenol PO (pre-op)* and Gabapentin PO (pre-op)*   Induction:   PONV Risk Score and Plan: 1 and Propofol infusion, Ondansetron and Treatment may vary due to age or medical condition  Airway Management Planned: Natural Airway and Simple Face Mask  Additional Equipment:   Intra-op Plan:   Post-operative Plan:   Informed Consent: I have reviewed the patients History and Physical, chart, labs and discussed the procedure including the risks, benefits and alternatives for the proposed anesthesia with the patient or authorized representative who has  indicated his/her understanding and acceptance.       Plan Discussed with: CRNA  Anesthesia Plan Comments:         Anesthesia Quick Evaluation

## 2022-04-19 ENCOUNTER — Encounter (HOSPITAL_COMMUNITY)
Admission: RE | Disposition: A | Discharge status: Home / Self Care | Payer: Self-pay | Source: Home / Self Care | Attending: Orthopedic Surgery

## 2022-04-19 ENCOUNTER — Ambulatory Visit (HOSPITAL_COMMUNITY): Payer: Medicare Other | Admitting: Anesthesiology

## 2022-04-19 ENCOUNTER — Other Ambulatory Visit: Payer: Self-pay

## 2022-04-19 ENCOUNTER — Ambulatory Visit (HOSPITAL_COMMUNITY)
Admission: RE | Admit: 2022-04-19 | Discharge: 2022-04-19 | Disposition: A | Discharge status: Home / Self Care | Payer: Medicare Other | Attending: Orthopedic Surgery | Admitting: Orthopedic Surgery

## 2022-04-19 ENCOUNTER — Encounter (HOSPITAL_COMMUNITY): Payer: Self-pay | Admitting: Orthopedic Surgery

## 2022-04-19 ENCOUNTER — Ambulatory Visit (HOSPITAL_COMMUNITY): Payer: Medicare Other | Admitting: Physician Assistant

## 2022-04-19 DIAGNOSIS — Z856 Personal history of leukemia: Secondary | ICD-10-CM | POA: Diagnosis not present

## 2022-04-19 DIAGNOSIS — G4730 Sleep apnea, unspecified: Secondary | ICD-10-CM | POA: Diagnosis not present

## 2022-04-19 DIAGNOSIS — I352 Nonrheumatic aortic (valve) stenosis with insufficiency: Secondary | ICD-10-CM | POA: Diagnosis not present

## 2022-04-19 DIAGNOSIS — Z9989 Dependence on other enabling machines and devices: Secondary | ICD-10-CM

## 2022-04-19 DIAGNOSIS — G4733 Obstructive sleep apnea (adult) (pediatric): Secondary | ICD-10-CM | POA: Diagnosis not present

## 2022-04-19 DIAGNOSIS — G8929 Other chronic pain: Secondary | ICD-10-CM

## 2022-04-19 DIAGNOSIS — M1711 Unilateral primary osteoarthritis, right knee: Secondary | ICD-10-CM | POA: Insufficient documentation

## 2022-04-19 DIAGNOSIS — Z01818 Encounter for other preprocedural examination: Secondary | ICD-10-CM

## 2022-04-19 HISTORY — DX: Unspecified osteoarthritis, unspecified site: M19.90

## 2022-04-19 HISTORY — PX: TOTAL KNEE ARTHROPLASTY: SHX125

## 2022-04-19 HISTORY — DX: Cardiac murmur, unspecified: R01.1

## 2022-04-19 LAB — CBC WITH DIFFERENTIAL/PLATELET
Abs Immature Granulocytes: 0.1 10*3/uL — ABNORMAL HIGH (ref 0.00–0.07)
Basophils Absolute: 0.1 10*3/uL (ref 0.0–0.1)
Basophils Relative: 1 %
Eosinophils Absolute: 0.1 10*3/uL (ref 0.0–0.5)
Eosinophils Relative: 1 %
HCT: 46.1 % (ref 39.0–52.0)
Hemoglobin: 14.8 g/dL (ref 13.0–17.0)
Immature Granulocytes: 1 %
Lymphocytes Relative: 60 %
Lymphs Abs: 8.1 10*3/uL — ABNORMAL HIGH (ref 0.7–4.0)
MCH: 28.4 pg (ref 26.0–34.0)
MCHC: 32.1 g/dL (ref 30.0–36.0)
MCV: 88.3 fL (ref 80.0–100.0)
Monocytes Absolute: 0.6 10*3/uL (ref 0.1–1.0)
Monocytes Relative: 5 %
Neutro Abs: 4.2 10*3/uL (ref 1.7–7.7)
Neutrophils Relative %: 32 %
Platelets: 162 10*3/uL (ref 150–400)
RBC: 5.22 MIL/uL (ref 4.22–5.81)
RDW: 13.3 % (ref 11.5–15.5)
WBC: 13.1 10*3/uL — ABNORMAL HIGH (ref 4.0–10.5)
nRBC: 0 % (ref 0.0–0.2)

## 2022-04-19 LAB — COMPREHENSIVE METABOLIC PANEL
ALT: 25 U/L (ref 0–44)
AST: 22 U/L (ref 15–41)
Albumin: 4.2 g/dL (ref 3.5–5.0)
Alkaline Phosphatase: 67 U/L (ref 38–126)
Anion gap: 7 (ref 5–15)
BUN: 22 mg/dL (ref 8–23)
CO2: 26 mmol/L (ref 22–32)
Calcium: 9.2 mg/dL (ref 8.9–10.3)
Chloride: 103 mmol/L (ref 98–111)
Creatinine, Ser: 0.77 mg/dL (ref 0.61–1.24)
GFR, Estimated: >60 mL/min (ref >60)
Glucose, Bld: 102 mg/dL — ABNORMAL HIGH (ref 70–99)
Potassium: 4.1 mmol/L (ref 3.5–5.1)
Sodium: 136 mmol/L (ref 135–145)
Total Bilirubin: 0.8 mg/dL (ref 0.3–1.2)
Total Protein: 6.7 g/dL (ref 6.5–8.1)

## 2022-04-19 SURGERY — ARTHROPLASTY, KNEE, TOTAL
Anesthesia: Spinal | Site: Knee | Laterality: Right

## 2022-04-19 MED ORDER — OXYCODONE HCL 5 MG PO TABS
ORAL_TABLET | ORAL | Status: AC
  Filled 2022-04-19: qty 1

## 2022-04-19 MED ORDER — ACETAMINOPHEN 500 MG PO TABS
1000.0000 mg | ORAL_TABLET | Freq: Once | ORAL | Status: AC
  Administered 2022-04-19: 1000 mg via ORAL
  Filled 2022-04-19: qty 2

## 2022-04-19 MED ORDER — TRANEXAMIC ACID-NACL 1000-0.7 MG/100ML-% IV SOLN
1000.0000 mg | INTRAVENOUS | Status: AC
  Administered 2022-04-19: 1000 mg via INTRAVENOUS
  Filled 2022-04-19: qty 100

## 2022-04-19 MED ORDER — FENTANYL CITRATE PF 50 MCG/ML IJ SOSY: 25.0000 ug | PREFILLED_SYRINGE | INTRAMUSCULAR | Status: DC | PRN

## 2022-04-19 MED ORDER — DEXAMETHASONE SODIUM PHOSPHATE 10 MG/ML IJ SOLN
8.0000 mg | Freq: Once | INTRAMUSCULAR | Status: AC
  Administered 2022-04-19: 10 mg via INTRAVENOUS

## 2022-04-19 MED ORDER — MIDAZOLAM HCL 2 MG/2ML IJ SOLN
INTRAMUSCULAR | Status: AC
  Filled 2022-04-19: qty 2

## 2022-04-19 MED ORDER — ACETAMINOPHEN 500 MG PO TABS: 1000.0000 mg | ORAL_TABLET | Freq: Four times a day (QID) | ORAL | Status: DC

## 2022-04-19 MED ORDER — SODIUM CHLORIDE 0.9 % IR SOLN
Status: DC | PRN
  Administered 2022-04-19: 1000 mL

## 2022-04-19 MED ORDER — OXYCODONE HCL 5 MG PO TABS
ORAL_TABLET | ORAL | Status: AC
  Filled 2022-04-19: qty 2

## 2022-04-19 MED ORDER — SODIUM CHLORIDE 0.9% FLUSH
INTRAVENOUS | Status: DC | PRN
  Administered 2022-04-19: 20 mL

## 2022-04-19 MED ORDER — LACTATED RINGERS IV BOLUS
250.0000 mL | Freq: Once | INTRAVENOUS | Status: AC
  Administered 2022-04-19: 250 mL via INTRAVENOUS

## 2022-04-19 MED ORDER — 0.9 % SODIUM CHLORIDE (POUR BTL) OPTIME
TOPICAL | Status: DC | PRN
  Administered 2022-04-19: 1000 mL

## 2022-04-19 MED ORDER — CHLORHEXIDINE GLUCONATE 0.12 % MT SOLN
15.0000 mL | Freq: Once | OROMUCOSAL | Status: AC
  Administered 2022-04-19: 15 mL via OROMUCOSAL

## 2022-04-19 MED ORDER — OXYCODONE HCL 5 MG PO TABS
5.0000 mg | ORAL_TABLET | ORAL | Status: DC | PRN
  Administered 2022-04-19: 10 mg via ORAL
  Administered 2022-04-19: 5 mg via ORAL

## 2022-04-19 MED ORDER — TRAMADOL HCL 50 MG PO TABS: 50.0000 mg | ORAL_TABLET | Freq: Four times a day (QID) | ORAL | Status: DC | PRN

## 2022-04-19 MED ORDER — PHENYLEPHRINE HCL-NACL 20-0.9 MG/250ML-% IV SOLN
INTRAVENOUS | Status: DC | PRN
  Administered 2022-04-19: 25 ug/min via INTRAVENOUS

## 2022-04-19 MED ORDER — PHENYLEPHRINE HCL (PRESSORS) 10 MG/ML IV SOLN
INTRAVENOUS | Status: AC
  Filled 2022-04-19: qty 1

## 2022-04-19 MED ORDER — POVIDONE-IODINE 10 % EX SWAB: 2.0000 | Freq: Once | CUTANEOUS | Status: DC

## 2022-04-19 MED ORDER — AMISULPRIDE (ANTIEMETIC) 5 MG/2ML IV SOLN: 10.0000 mg | Freq: Once | INTRAVENOUS | Status: DC | PRN

## 2022-04-19 MED ORDER — BUPIVACAINE IN DEXTROSE 0.75-8.25 % IT SOLN
INTRATHECAL | Status: DC | PRN
  Administered 2022-04-19: 1.6 mL via INTRATHECAL

## 2022-04-19 MED ORDER — ACETAMINOPHEN 500 MG PO TABS
ORAL_TABLET | ORAL | Status: AC
  Administered 2022-04-19: 1000 mg via ORAL
  Filled 2022-04-19: qty 2

## 2022-04-19 MED ORDER — PROPOFOL 10 MG/ML IV BOLUS
INTRAVENOUS | Status: DC | PRN
  Administered 2022-04-19: 20 mg via INTRAVENOUS

## 2022-04-19 MED ORDER — GABAPENTIN 300 MG PO CAPS
300.0000 mg | ORAL_CAPSULE | Freq: Once | ORAL | Status: AC
  Administered 2022-04-19: 300 mg via ORAL
  Filled 2022-04-19: qty 1

## 2022-04-19 MED ORDER — PROPOFOL 10 MG/ML IV BOLUS
INTRAVENOUS | Status: AC
  Filled 2022-04-19: qty 20

## 2022-04-19 MED ORDER — MIDAZOLAM HCL 5 MG/5ML IJ SOLN
INTRAMUSCULAR | Status: DC | PRN
  Administered 2022-04-19: 1 mg via INTRAVENOUS

## 2022-04-19 MED ORDER — SODIUM CHLORIDE (PF) 0.9 % IJ SOLN
INTRAMUSCULAR | Status: AC
  Filled 2022-04-19: qty 20

## 2022-04-19 MED ORDER — METHOCARBAMOL 500 MG PO TABS: 500.0000 mg | ORAL_TABLET | Freq: Four times a day (QID) | ORAL | 0 refills | Status: DC

## 2022-04-19 MED ORDER — BUPIVACAINE LIPOSOME 1.3 % IJ SUSP
INTRAMUSCULAR | Status: DC | PRN
  Administered 2022-04-19: 20 mL

## 2022-04-19 MED ORDER — BUPIVACAINE HCL 0.25 % IJ SOLN
INTRAMUSCULAR | Status: AC
  Filled 2022-04-19: qty 1

## 2022-04-19 MED ORDER — PROPOFOL 500 MG/50ML IV EMUL
INTRAVENOUS | Status: DC | PRN
  Administered 2022-04-19: 25 ug/kg/min via INTRAVENOUS

## 2022-04-19 MED ORDER — ORAL CARE MOUTH RINSE: 15.0000 mL | Freq: Once | OROMUCOSAL | Status: AC

## 2022-04-19 MED ORDER — BUPIVACAINE HCL (PF) 0.25 % IJ SOLN
INTRAMUSCULAR | Status: DC | PRN
  Administered 2022-04-19: 30 mL

## 2022-04-19 MED ORDER — LACTATED RINGERS IV SOLN: INTRAVENOUS | Status: DC

## 2022-04-19 MED ORDER — METHOCARBAMOL 500 MG PO TABS
ORAL_TABLET | ORAL | Status: AC
  Filled 2022-04-19: qty 1

## 2022-04-19 MED ORDER — LACTATED RINGERS IV BOLUS
500.0000 mL | Freq: Once | INTRAVENOUS | Status: AC
  Administered 2022-04-19: 500 mL via INTRAVENOUS

## 2022-04-19 MED ORDER — OXYCODONE HCL 5 MG PO TABS: 5.0000 mg | ORAL_TABLET | Freq: Four times a day (QID) | ORAL | 0 refills | Status: DC | PRN

## 2022-04-19 MED ORDER — ASPIRIN 81 MG PO CHEW: 81.0000 mg | CHEWABLE_TABLET | Freq: Two times a day (BID) | ORAL | 0 refills | Status: DC

## 2022-04-19 MED ORDER — ROPIVACAINE HCL 5 MG/ML IJ SOLN
INTRAMUSCULAR | Status: DC | PRN
  Administered 2022-04-19: 20 mL via PERINEURAL

## 2022-04-19 MED ORDER — WATER FOR IRRIGATION, STERILE IR SOLN
Status: DC | PRN
  Administered 2022-04-19: 2000 mL

## 2022-04-19 MED ORDER — FENTANYL CITRATE (PF) 100 MCG/2ML IJ SOLN
INTRAMUSCULAR | Status: AC
  Filled 2022-04-19: qty 2

## 2022-04-19 MED ORDER — CEFAZOLIN IN SODIUM CHLORIDE 3-0.9 GM/100ML-% IV SOLN
3.0000 g | INTRAVENOUS | Status: AC
  Administered 2022-04-19: 3 g via INTRAVENOUS
  Filled 2022-04-19: qty 100

## 2022-04-19 MED ORDER — BUPIVACAINE LIPOSOME 1.3 % IJ SUSP: 20.0000 mL | Freq: Once | INTRAMUSCULAR | Status: DC

## 2022-04-19 MED ORDER — METHOCARBAMOL 500 MG PO TABS
500.0000 mg | ORAL_TABLET | Freq: Four times a day (QID) | ORAL | Status: DC | PRN
  Administered 2022-04-19: 500 mg via ORAL

## 2022-04-19 MED ORDER — METHOCARBAMOL 500 MG IVPB - SIMPLE MED: 500.0000 mg | Freq: Four times a day (QID) | INTRAVENOUS | Status: DC | PRN

## 2022-04-19 MED ORDER — FENTANYL CITRATE (PF) 100 MCG/2ML IJ SOLN
INTRAMUSCULAR | Status: DC | PRN
  Administered 2022-04-19: 50 ug via INTRAVENOUS

## 2022-04-19 MED ORDER — CLONIDINE HCL (ANALGESIA) 100 MCG/ML EP SOLN
EPIDURAL | Status: DC | PRN
  Administered 2022-04-19: 50 ug

## 2022-04-19 MED ORDER — ONDANSETRON HCL 4 MG/2ML IJ SOLN
INTRAMUSCULAR | Status: DC | PRN
  Administered 2022-04-19: 4 mg via INTRAVENOUS

## 2022-04-19 MED ORDER — BUPIVACAINE LIPOSOME 1.3 % IJ SUSP
INTRAMUSCULAR | Status: AC
  Filled 2022-04-19: qty 20

## 2022-04-19 SURGICAL SUPPLY — 55 items
BAG COUNTER SPONGE SURGICOUNT (BAG) IMPLANT
BAG ZIPLOCK 12X15 (MISCELLANEOUS) ×1 IMPLANT
BLADE SAGITTAL 13X1.27X60 (BLADE) ×1 IMPLANT
BLADE SAW SGTL 18X1.27X75 (BLADE) ×1 IMPLANT
BLADE SURG 15 STRL LF DISP TIS (BLADE) ×1 IMPLANT
BLADE SURG 15 STRL SS (BLADE) ×1
BLADE SURG SZ10 CARB STEEL (BLADE) ×2 IMPLANT
BNDG ELASTIC 6X5.8 VLCR STR LF (GAUZE/BANDAGES/DRESSINGS) ×1 IMPLANT
BOWL SMART MIX CTS (DISPOSABLE) ×1 IMPLANT
CEMENT BONE R 1X40 (Cement) ×2 IMPLANT
COVER SURGICAL LIGHT HANDLE (MISCELLANEOUS) ×1 IMPLANT
CUFF TOURN SGL QUICK 34 (TOURNIQUET CUFF) ×1
CUFF TRNQT CYL 34X4.125X (TOURNIQUET CUFF) ×1 IMPLANT
DRAPE INCISE IOBAN 66X45 STRL (DRAPES) ×2 IMPLANT
DRAPE U-SHAPE 47X51 STRL (DRAPES) ×1 IMPLANT
DRSG AQUACEL AG ADV 3.5X10 (GAUZE/BANDAGES/DRESSINGS) ×1 IMPLANT
DURAPREP 26ML APPLICATOR (WOUND CARE) ×2 IMPLANT
ELECT REM PT RETURN 15FT ADLT (MISCELLANEOUS) ×1 IMPLANT
FEMUR  CMT CCR STD SZ11 R KNEE (Knees) ×1 IMPLANT
FEMUR CMT CCR STD SZ11 R KNEE (Knees) ×1 IMPLANT
FEMUR CMTD CCR STD SZ11 R KNEE (Knees) IMPLANT
GLOVE BIOGEL PI IND STRL 7.5 (GLOVE) ×1 IMPLANT
GLOVE BIOGEL PI IND STRL 8.5 (GLOVE) ×2 IMPLANT
GLOVE SURG LX STRL 7.5 STRW (GLOVE) ×1 IMPLANT
GLOVE SURG LX STRL 8.0 MICRO (GLOVE) ×2 IMPLANT
GLOVE SURG ORTHO 8.0 STRL STRW (GLOVE) ×2 IMPLANT
GOWN STRL REUS W/ TWL XL LVL3 (GOWN DISPOSABLE) ×2 IMPLANT
GOWN STRL REUS W/TWL XL LVL3 (GOWN DISPOSABLE) ×2
HANDPIECE INTERPULSE COAX TIP (DISPOSABLE) ×1
HOLDER FOLEY CATH W/STRAP (MISCELLANEOUS) ×1 IMPLANT
HOOD PEEL AWAY T7 (MISCELLANEOUS) ×3 IMPLANT
INSERT TIBIAL PLY R EF10-11X12 (Insert) IMPLANT
KIT TURNOVER KIT A (KITS) IMPLANT
MANIFOLD NEPTUNE II (INSTRUMENTS) ×1 IMPLANT
NDL HYPO 21X1.5 SAFETY (NEEDLE) ×1 IMPLANT
NEEDLE HYPO 21X1.5 SAFETY (NEEDLE) ×1 IMPLANT
NS IRRIG 1000ML POUR BTL (IV SOLUTION) ×1 IMPLANT
PACK TOTAL KNEE CUSTOM (KITS) ×1 IMPLANT
PROTECTOR NERVE ULNAR (MISCELLANEOUS) ×1 IMPLANT
SET HNDPC FAN SPRY TIP SCT (DISPOSABLE) ×1 IMPLANT
SPIKE FLUID TRANSFER (MISCELLANEOUS) ×2 IMPLANT
STEM POLY PAT PLY 38M KNEE (Knees) IMPLANT
STEM TIBIA 5 DEG SZ F R KNEE (Knees) IMPLANT
STRIP CLOSURE SKIN 1/2X4 (GAUZE/BANDAGES/DRESSINGS) ×1 IMPLANT
SUT BONE WAX W31G (SUTURE) ×1 IMPLANT
SUT MNCRL AB 3-0 PS2 18 (SUTURE) ×1 IMPLANT
SUT STRATAFIX 0 PDS 27 VIOLET (SUTURE) ×1
SUT STRATAFIX 1PDS 45CM VIOLET (SUTURE) ×1 IMPLANT
SUT VIC AB 1 CT1 36 (SUTURE) ×1 IMPLANT
SUTURE STRATFX 0 PDS 27 VIOLET (SUTURE) ×1 IMPLANT
SYR 30ML LL (SYRINGE) ×2 IMPLANT
TIBIA STEM 5 DEG SZ F R KNEE (Knees) ×1 IMPLANT
TRAY FOLEY MTR SLVR 16FR STAT (SET/KITS/TRAYS/PACK) ×1 IMPLANT
WATER STERILE IRR 1000ML POUR (IV SOLUTION) ×2 IMPLANT
WRAP KNEE MAXI GEL POST OP (GAUZE/BANDAGES/DRESSINGS) ×1 IMPLANT

## 2022-04-19 NOTE — Anesthesia Procedure Notes (Signed)
Anesthesia Regional Block: Adductor canal block   Pre-Anesthetic Checklist: , timeout performed,  Correct Patient, Correct Site, Correct Laterality,  Correct Procedure, Correct Position, site marked,  Risks and benefits discussed,  Surgical consent,  Pre-op evaluation,  At surgeon's request and post-op pain management  Laterality: Right  Prep: chloraprep       Needles:  Injection technique: Single-shot  Needle Type: Echogenic Needle     Needle Length: 9cm  Needle Gauge: 21     Additional Needles:   Procedures:,,,, ultrasound used (permanent image in chart),,    Narrative:  Start time: 04/19/2022 7:03 AM End time: 04/19/2022 7:08 AM Injection made incrementally with aspirations every 5 mL.  Performed by: Personally  Anesthesiologist: Suzette Battiest, MD

## 2022-04-19 NOTE — Op Note (Signed)
TOTAL KNEE REPLACEMENT OPERATIVE NOTE:  04/19/2022  9:18 AM  PATIENT:  Sean Parks  67 y.o. 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 male who has failed conservative treatment for Osteoarthritis of right knee M17.11.  Informed consent was obtained prior to anesthesia. The risks versus benefits of the operation were explain and in a way the patient can, and did, understand.     Description of procedure:     The patient was taken to the operating room and placed under anesthesia.  The patient was positioned in the usual fashion taking care that all body parts were adequately padded and/or protected.  A tourniquet was applied and the leg prepped and draped in the usual sterile fashion.  The extremity was exsanguinated with the esmarch and tourniquet inflated to 350 mmHg.  Pre-operative range of motion was normal.    A midline incision approximately 6-7 inches long was made with a #10 blade.  A new blade was used to make a parapatellar arthrotomy going 2-3 cm into the quadriceps tendon, over the patella, and alongside the medial aspect of the patellar tendon.  A synovectomy was then performed with the #10 blade and forceps. I then elevated the deep MCL off the medial tibial metaphysis subperiosteally around to the semimembranosus attachment.    I everted the patella and used calipers to measure patellar thickness.  I used the reamer to ream down to appropriate thickness to recreate the native thickness.  I then removed excess bone  with the rongeur and sagittal saw.  I used the appropriately sized template and drilled the three lug holes.  I then put the trial in place and measured the thickness with the calipers to ensure recreation of the native thickness.  The trial was then removed and the patella subluxed and the knee brought into flexion.  A homan retractor was place to retract and protect the patella and lateral structures.  A Z-retractor was place medially to protect the medial structures.  The extra-medullary alignment system was used to make cut the tibial articular surface perpendicular to the anamotic axis of the tibia and in 3 degrees of posterior slope.  The cut surface and alignment jig was removed.  I then used the intramedullary alignment guide to make a valgus cut on the distal femur.  I then marked out the epicondylar axis on the distal femur.    I then used the anterior referencing sizer and measured the femur to be a size  11 .  The 4-In-1 cutting block was screwed into place in external rotation matching the posterior condylar angle, making our cuts perpendicular to the epicondylar axis.  Anterior, posterior and chamfer cuts were made with the sagittal saw.  The cutting block and cut pieces were removed.  A lamina spreader was placed in 90 degrees of flexion.  The ACL, PCL, menisci, and posterior condylar osteophytes were removed.  A 12 mm spacer blocked was found to offer good flexion and extension gap balance after minimal in degree releasing.   The scoop retractor was  then placed and the femoral finishing block was pinned in place.  The small sagittal saw was used as well as the lug drill to finish the femur.  The block and cut surfaces were removed and the medullary canal hole filled with autograft bone from the cut pieces.  The tibia was delivered forward in deep flexion and external rotation.  A size F tray was selected and pinned into place centered on the medial 1/3 of the tibial tubercle.  The reamer and  keel was used to prepare the tibia through the tray.    I then trialed with the size  11  femur, size F tibia, a 12 mm insert and the 38 patella.  I had excellent flexion/extension gap balance, excellent patella tracking.  Flexion was full and beyond 120 degrees; extension was zero.  These components were chosen and the staff opened them to me on the back table while the knee was lavaged copiously and the cement mixed.  The soft tissue was infiltrated with 60cc of exparel 1.3% through a 21 gauge needle.  I cemented in the components and removed all excess cement.  The polyethylene tibial component was snapped into place and the knee placed in extension while cement was hardening.  The capsule was infilltrated with a 60cc exparel/marcaine/saline mixture.   Once the cement was hard, the tourniquet was let down.  Hemostasis was obtained.  The arthrotomy was closed using a #1 stratofix running suture.  The deep soft tissues were closed with #0 vicryls and the subcuticular layer closed with #2-0 vicryl.  The skin was reapproximated and closed with 3.0 Monocryl.  The wound was covered with steristrips, aquacel dressing, and a TED stocking.   The patient was then awakened, extubated, and taken to the recovery room in stable condition.  BLOOD LOSS:  716RC COMPLICATIONS:  None.  PLAN OF CARE: Discharge to home after PACU  PATIENT DISPOSITION:  PACU - hemodynamically stable.     Please fax a copy of this op note to my office at [phone] (please only include page 1 and 2 of the Case Information op note)

## 2022-04-19 NOTE — Evaluation (Signed)
Physical Therapy Evaluation Patient Details Name: Sean Parks MRN: [id number] DOB: July 21, 1955 Today's Date: 04/19/2022  History of Present Illness  Pt is a 67yo male presenting s/p R-TKA on 04/19/22. PMH: hx of CLL, OSA on CPAP, L-TKA 2014.  Clinical Impression  Sean Parks is a 67 y.o. male POD 0 s/p R-TKA. Patient reports independence with mobility at baseline. Patient is now limited by functional impairments (see PT problem list below) and requires min guard for transfers and gait with RW. Patient was able to ambulate 100 feet with RW and min guard and cues for safe walker management. Patient educated on safe sequencing for stair mobility and verbalized safe guarding position for people assisting with mobility. Patient instructed in exercises to facilitate ROM and circulation. Patient will benefit from continued skilled PT interventions to address impairments and progress towards PLOF. Patient has met mobility goals at adequate level for discharge home; will continue to follow if pt continues acute stay to progress towards Mod I goals.       Recommendations for follow up therapy are one component of a multi-disciplinary discharge planning process, led by the attending physician.  Recommendations may be updated based on patient status, additional functional criteria and insurance authorization.  Follow Up Recommendations Follow physician's recommendations for discharge plan and follow up therapies      Assistance Recommended at Discharge Frequent or constant Supervision/Assistance  Patient can return home with the following  A little help with walking and/or transfers;A little help with bathing/dressing/bathroom;Assistance with cooking/housework;Assist for transportation;Help with stairs or ramp for entrance    Equipment Recommendations None recommended by PT  Recommendations for Other Services       Functional Status Assessment Patient has had a recent decline in their functional  status and demonstrates the ability to make significant improvements in function in a reasonable and predictable amount of time.     Precautions / Restrictions Precautions Precautions: Knee Precaution Booklet Issued: No Precaution Comments: No pillow under the knee Restrictions Weight Bearing Restrictions: No Other Position/Activity Restrictions: wbat      Mobility  Bed Mobility Overal bed mobility: Needs Assistance Bed Mobility: Supine to Sit     Supine to sit: Min guard     General bed mobility comments: For safety only, no physical assist required    Transfers Overall transfer level: Needs assistance Equipment used: Rolling walker (2 wheels) Transfers: Sit to/from Stand Sit to Stand: Min guard, From elevated surface           General transfer comment: For safety only, no physical assist required. Multimodal cues for hand placement and powering up from the bed.    Ambulation/Gait Ambulation/Gait assistance: Min guard Gait Distance (Feet): 100 Feet Assistive device: Rolling walker (2 wheels) Gait Pattern/deviations: Step-to pattern Gait velocity: decreased     General Gait Details: Pt ambulated with RW and min guard, no physical assist required or overt LOB noted.  Stairs Stairs: Yes Stairs assistance: Min guard Stair Management: Two rails, Step to pattern, Forwards Number of Stairs: 2 General stair comments: Pt completed stair mobility with min guard, VCs for sequencing, no physical assist required or overt LOB noted  Wheelchair Mobility    Modified Rankin (Stroke Patients Only)       Balance Overall balance assessment: Needs assistance Sitting-balance support: Feet supported, No upper extremity supported Sitting balance-Leahy Scale: Good     Standing balance support: Reliant on assistive device for balance, During functional activity, Bilateral upper extremity supported Standing balance-Leahy Scale: Poor  Pertinent Vitals/Pain Pain Assessment Pain Assessment: 0-10 Pain Score: 4  Pain Location: right knee Pain Descriptors / Indicators: Operative site guarding Pain Intervention(s): Limited activity within patient's tolerance, Monitored during session, Repositioned, Ice applied    Home Living Family/patient expects to be discharged to:: Private residence Living Arrangements: Spouse/significant other Available Help at Discharge: Family;Available 24 hours/day Type of Home: House Home Access: Level entry       Home Layout: One level Home Equipment: Conservation officer, nature (2 wheels);BSC/3in1;Shower seat - built in      Prior Function Prior Level of Function : Independent/Modified Independent;Driving;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)             Mobility Comments: IND ADLs Comments: IND     Hand Dominance   Dominant Hand: Right    Extremity/Trunk Assessment   Upper Extremity Assessment Upper Extremity Assessment: Overall WFL for tasks assessed    Lower Extremity Assessment Lower Extremity Assessment: RLE deficits/detail;LLE deficits/detail RLE Deficits / Details: MMT ank df/pf 3+/5, pt barely able to perform SLR, mildy extensor lag noted RLE Sensation: decreased light touch LLE Deficits / Details: MMT ank DF/PF 5/5 LLE Sensation: WNL    Cervical / Trunk Assessment Cervical / Trunk Assessment: Kyphotic  Communication   Communication: No difficulties  Cognition Arousal/Alertness: Awake/alert Behavior During Therapy: WFL for tasks assessed/performed Overall Cognitive Status: Within Functional Limits for tasks assessed                                          General Comments      Exercises Total Joint Exercises Ankle Circles/Pumps: AROM, Both, 10 reps Quad Sets: AROM, Right, Other reps (comment) (2) Short Arc Quad: Right, Other reps (comment), AAROM (2) Heel Slides: AROM, Right, Other reps (comment) (2) Hip ABduction/ADduction: AROM, Right,  Other reps (comment) (2) Straight Leg Raises: AAROM, Right, 5 reps Long Arc Quad: Other (comment) (educated, not performed) Knee Flexion: AROM, AAROM, Other (comment) (educated not performed) Goniometric ROM: -5-30deg by gross visual approximation   Assessment/Plan    PT Assessment Patient needs continued PT services  PT Problem List Decreased strength;Decreased range of motion;Decreased activity tolerance;Decreased balance;Decreased mobility;Decreased coordination;Pain       PT Treatment Interventions DME instruction;Gait training;Stair training;Functional mobility training;Therapeutic activities;Therapeutic exercise;Balance training;Neuromuscular re-education;Patient/family education    PT Goals (Current goals can be found in the Care Plan section)  Acute Rehab PT Goals Patient Stated Goal: to go home PT Goal Formulation: With patient Time For Goal Achievement: 04/26/22 Potential to Achieve Goals: Good    Frequency 7X/week     Co-evaluation               AM-PAC PT "6 Clicks" Mobility  Outcome Measure Help needed turning from your back to your side while in a flat bed without using bedrails?: None Help needed moving from lying on your back to sitting on the side of a flat bed without using bedrails?: A Little Help needed moving to and from a bed to a chair (including a wheelchair)?: A Little Help needed standing up from a chair using your arms (e.g., wheelchair or bedside chair)?: A Little Help needed to walk in hospital room?: A Little Help needed climbing 3-5 steps with a railing? : A Little 6 Click Score: 19    End of Session Equipment Utilized During Treatment: Gait belt Activity Tolerance: Patient tolerated treatment well Patient left: in chair;with call bell/phone within  reach;with family/visitor present Nurse Communication: Mobility status;Patient requests pain meds PT Visit Diagnosis: Other abnormalities of gait and mobility (R26.89);Pain Pain - Right/Left:  Right Pain - part of body: Knee    Time: 0086-7619 PT Time Calculation (min) (ACUTE ONLY): 40 min   Charges:   PT Evaluation $PT Eval Low Complexity: 1 Low PT Treatments $Gait Training: 8-22 mins $Therapeutic Exercise: 8-22 mins        Coolidge Breeze, PT, DPT [facility] Department Office: [phone] Weekend pager: [phone]  Coolidge Breeze 04/19/2022, 3:53 PM

## 2022-04-19 NOTE — Anesthesia Procedure Notes (Signed)
Procedure Name: MAC Date/Time: 04/19/2022 7:36 AM  Performed by: Niel Hummer, CRNAPre-anesthesia Checklist: Patient identified, Emergency Drugs available, Suction available and Patient being monitored Oxygen Delivery Method: Simple face mask

## 2022-04-19 NOTE — H&P (Signed)
Sean Parks MRN:  [id number] DOB/SEX:  1955/08/16/male  CHIEF COMPLAINT:  Painful right Knee  HISTORY: Patient is a 67 y.o. male presented with a history of pain in the right knee. Onset of symptoms was gradual starting a few months ago with gradually worsening course since that time. Patient has been treated conservatively with over-the-counter NSAIDs and activity modification. Patient currently rates pain in the knee at 10 out of 10 with activity. There is pain at night.  PAST MEDICAL HISTORY: There are no problems to display for this patient.  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   Heart murmur    Sleep apnea    Wears CPAP   Past Surgical History:  Procedure Laterality Date   bone spurs   04/15/2010   Removed from L shoulder   COLONOSCOPY W/ BIOPSIES     FRACTURE SURGERY     L foot when pt was 67 years old   LYMPH NODE BIOPSY     right hip repolacement      TOTAL KNEE ARTHROPLASTY Left 03/01/2013   Procedure: TOTAL KNEE ARTHROPLASTY;  Surgeon: Vickey Huger, MD;  Location: [facility];  Service: Orthopedics;  Laterality: Left;     MEDICATIONS:   Medications Prior to Admission  Medication Sig Dispense Refill Last Dose   cetirizine-pseudoephedrine (ZYRTEC-D) 5-120 MG per tablet Take 1 tablet by mouth daily as needed for allergies.   Past Week   ibuprofen (ADVIL) 200 MG tablet Take 400 mg by mouth 2 (two) times daily as needed for moderate pain.   Past Month   minocycline (DYNACIN) 100 MG tablet Take 100 mg by mouth daily as needed (rosacea).   Past Month   PRESCRIPTION MEDICATION Apply 1 Application topically daily as needed (precancerous spots). FLUOROURACIL 5% + CALCIPOTRIENE 0.005%   Past Week   tamsulosin (FLOMAX) 0.4 MG CAPS capsule Take 0.4 mg by mouth every other day.   04/18/2022   traMADol (ULTRAM) 50 MG tablet Take 50 mg by mouth every 6 (six) hours as needed for moderate pain.    Past Week    ALLERGIES:  No Known Allergies  REVIEW OF SYSTEMS:  A comprehensive review of systems was negative except for: Musculoskeletal: positive for arthralgias and bone pain   FAMILY HISTORY:  History reviewed. No pertinent family history.  SOCIAL HISTORY:   Social History   Tobacco Use   Smoking status: Never   Smokeless tobacco: Never  Substance Use Topics   Alcohol use: Yes    Alcohol/week: 2.0 standard drinks of alcohol    Types: 2 Glasses of wine per week    Comment: twice a week     EXAMINATION:  Vital signs in last 24 hours: Temp:  [97.6 F (36.4 C)] 97.6 F (36.4 C) (01/05 0555) Pulse Rate:  [77] 77 (01/05 0555) Resp:  [16] 16 (01/05 0555) BP: (137)/(72) 137/72 (01/05 0555) SpO2:  [96 %] 96 % (01/05 0555) Weight:  [127.5 kg] 127.5 kg (01/05 0530)  BP 137/72   Pulse 77   Temp 97.6 F (36.4 C) (Oral)   Resp 16   Ht '5\' 11"'$  (1.803 m)   Wt 127.5 kg   SpO2 96%   BMI 39.19 kg/m   General Appearance:    Alert, cooperative, no distress, appears stated age  Head:    Normocephalic, without obvious abnormality, atraumatic  Eyes:    PERRL, conjunctiva/corneas clear, EOM's intact,  fundi    benign, both eyes       Ears:    Normal TM's and external ear canals, both ears  Nose:   Nares normal, septum midline, mucosa normal, no drainage    or sinus tenderness  Throat:   Lips, mucosa, and tongue normal; teeth and gums normal  Neck:   Supple, symmetrical, trachea midline, no adenopathy;       thyroid:  No enlargement/tenderness/nodules; no carotid   bruit or JVD  Back:     Symmetric, no curvature, ROM normal, no CVA tenderness  Lungs:     Clear to auscultation bilaterally, respirations unlabored  Chest wall:    No tenderness or deformity  Heart:    Regular rate and rhythm, S1 and S2 normal, no murmur, rub   or gallop  Abdomen:     Soft, non-tender, bowel sounds active all four quadrants,    no masses, no organomegaly  Genitalia:    Normal male without lesion,  discharge or tenderness  Rectal:    Normal tone, normal prostate, no masses or tenderness;   guaiac negative stool  Extremities:   Extremities normal, atraumatic, no cyanosis or edema  Pulses:   2+ and symmetric all extremities  Skin:   Skin color, texture, turgor normal, no rashes or lesions  Lymph nodes:   Cervical, supraclavicular, and axillary nodes normal  Neurologic:   CNII-XII intact. Normal strength, sensation and reflexes      throughout    Musculoskeletal:  ROM 0-120, Ligaments intact,  Imaging Review Plain radiographs demonstrate severe degenerative joint disease of the right knee. The overall alignment is neutral. The bone quality appears to be good for age and reported activity level.  Assessment/Plan: Primary osteoarthritis, right knee   The patient history, physical examination and imaging studies are consistent with advanced degenerative joint disease of the right knee. The patient has failed conservative treatment.  The clearance notes were reviewed.  After discussion with the patient it was felt that Total Knee Replacement was indicated. The procedure,  risks, and benefits of total knee arthroplasty were presented and reviewed. The risks including but not limited to aseptic loosening, infection, blood clots, vascular injury, stiffness, patella tracking problems complications among others were discussed. The patient acknowledged the explanation, agreed to proceed with the plan.  Preoperative templating of the joint replacement has been completed, documented, and submitted to the Operating Room personnel in order to optimize intra-operative equipment management.    Patient's anticipated LOS is less than 2 midnights, meeting these requirements: - Lives within 1 hour of care - Has a competent adult at home to recover with post-op recover - NO history of  - Chronic pain requiring opiods  - Diabetes  - Coronary Artery Disease  - Heart failure  - Heart attack  - Stroke  -  DVT/VTE  - Cardiac arrhythmia  - Respiratory Failure/COPD  - Renal failure  - Anemia  - Advanced Liver disease     Donia Ast 04/19/2022, 7:00 AM

## 2022-04-19 NOTE — Progress Notes (Signed)
Orthopedic Tech Progress Note Patient Details:  Sean Parks August 07, 1955 [id number]  Ortho Devices Type of Ortho Device: Bone foam zero knee Ortho Device/Splint Location: Right knee Ortho Device/Splint Interventions: Application   Post Interventions Patient Tolerated: Well  [name] E [name] 04/19/2022, 10:05 AM

## 2022-04-19 NOTE — Transfer of Care (Signed)
Immediate Anesthesia Transfer of Care Note  Patient: Sean Parks  Procedure(s) Performed: TOTAL KNEE ARTHROPLASTY (Right: Knee)  Patient Location: PACU  Anesthesia Type:Spinal  Level of Consciousness: awake, alert , and oriented  Airway & Oxygen Therapy: Patient Spontanous Breathing and Patient connected to face mask oxygen  Post-op Assessment: Report given to RN and Post -op Vital signs reviewed and stable  Post vital signs: Reviewed and stable  Last Vitals:  Vitals Value Taken Time  BP 129/64   Temp    Pulse 79 04/19/22 0917  Resp 15 04/19/22 0917  SpO2 99 % 04/19/22 0917  Vitals shown include unvalidated device data.  Last Pain:  Vitals:   04/19/22 0555  TempSrc: Oral  PainSc:          Complications: No notable events documented.

## 2022-04-19 NOTE — Anesthesia Postprocedure Evaluation (Signed)
Anesthesia Post Note  Patient: Sean Parks  Procedure(s) Performed: TOTAL KNEE ARTHROPLASTY (Right: Knee)     Patient location during evaluation: PACU Anesthesia Type: Spinal Level of consciousness: awake and alert Pain management: pain level controlled Vital Signs Assessment: post-procedure vital signs reviewed and stable Respiratory status: spontaneous breathing and respiratory function stable Cardiovascular status: blood pressure returned to baseline and stable Postop Assessment: spinal receding Anesthetic complications: no  No notable events documented.  Last Vitals:  Vitals:   04/19/22 1415 04/19/22 1430  BP: (!) 154/79 (!) 143/69  Pulse: 98 99  Resp:    Temp:    SpO2: 99% 100%    Last Pain:  Vitals:   04/19/22 1415  TempSrc:   PainSc: 0-No pain                 Tiajuana Amass

## 2022-04-19 NOTE — Anesthesia Procedure Notes (Signed)
Spinal  Patient location during procedure: OR Start time: 04/19/2022 7:35 AM End time: 04/19/2022 7:40 AM Reason for block: surgical anesthesia Staffing Anesthesiologist: Suzette Battiest, MD Performed by: Suzette Battiest, MD Authorized by: Suzette Battiest, MD   Preanesthetic Checklist Completed: patient identified, IV checked, site marked, risks and benefits discussed, surgical consent, monitors and equipment checked, pre-op evaluation and timeout performed Spinal Block Patient position: sitting Prep: DuraPrep Patient monitoring: heart rate, cardiac monitor, continuous pulse ox and blood pressure Approach: midline Location: L4-5 Injection technique: single-shot Needle Needle type: Pencan  Needle gauge: 24 G Needle length: 9 cm Assessment Sensory level: T4 Events: CSF return

## 2022-04-23 ENCOUNTER — Encounter (HOSPITAL_COMMUNITY): Payer: Self-pay | Admitting: Orthopedic Surgery

## 2023-03-07 NOTE — Progress Notes (Signed)
Left voicemail for Sean Parks to request pre op orders in [facility].

## 2023-03-10 ENCOUNTER — Other Ambulatory Visit: Payer: Self-pay | Admitting: Orthopedic Surgery

## 2023-03-10 DIAGNOSIS — M25562 Pain in left knee: Secondary | ICD-10-CM

## 2023-03-10 NOTE — Patient Instructions (Signed)
SURGICAL WAITING ROOM VISITATION Patients having surgery or a procedure may have no more than 2 support people in the waiting area - these visitors may rotate in the visitor waiting room.   Due to an increase in RSV and influenza rates and associated hospitalizations, children ages 42 and under may not visit patients in [facility] hospitals. If the patient needs to stay at the hospital during part of their recovery, the visitor guidelines for inpatient rooms apply.  PRE-OP VISITATION  Pre-op nurse will coordinate an appropriate time for 1 support person to accompany the patient in pre-op.  This support person may not rotate.  This visitor will be contacted when the time is appropriate for the visitor to come back in the pre-op area.  Please refer to the [facility] website for the visitor guidelines for Inpatients (after your surgery is over and you are in a regular room).  You are not required to quarantine at this time prior to your surgery. However, you must do this: Hand Hygiene often Do NOT share personal items Notify your provider if you are in close contact with someone who has COVID or you develop fever 100.4 or greater, new onset of sneezing, cough, sore throat, shortness of breath or body aches.  If you test positive for Covid or have been in contact with anyone that has tested positive in the last 10 days please notify you surgeon.    Your procedure is scheduled on:  Friday  March 21, 2023  Report to [facility] Main Entrance: [facility] entrance where the [employer] is available.   Report to admitting at: 05:15 AM  Call this number if you have any questions or problems the morning of surgery [phone]  Do not eat food after Midnight the night prior to your surgery/procedure.  After Midnight you may have the following liquids until  04:15 AM DAY OF SURGERY  Clear Liquid Diet Water Black Coffee (sugar ok, NO MILK/CREAM OR CREAMERS)  Tea (sugar ok, NO  MILK/CREAM OR CREAMERS) regular and decaf                             Plain Jell-O  with no fruit (NO RED)                                           Fruit ices (not with fruit pulp, NO RED)                                     Popsicles (NO RED)                                                                  Juice: NO CITRUS JUICES: only apple, WHITE grape, WHITE cranberry Sports drinks like Gatorade or Powerade (NO RED)                   The day of surgery:  Drink ONE (1) Pre-Surgery Clear Ensure at   04:15 AM the morning of surgery. Drink in one  sitting. Do not sip.  This drink was given to you during your hospital pre-op appointment visit. Nothing else to drink after completing the Pre-Surgery Clear Ensure : No candy, chewing gum or throat lozenges.    FOLLOW ANY ADDITIONAL PRE OP INSTRUCTIONS YOU RECEIVED FROM YOUR SURGEON'S OFFICE!!!   Oral Hygiene is also important to reduce your risk of infection.        Remember - BRUSH YOUR TEETH THE MORNING OF SURGERY WITH YOUR REGULAR TOOTHPASTE  Do NOT smoke after Midnight the night before surgery.  STOP TAKING all Vitamins, Herbs and supplements 1 week before your surgery.   Take ONLY these medicines the morning of surgery with A SIP OF WATER: none   If You have been diagnosed with Sleep Apnea - Bring CPAP mask and tubing day of surgery. We will provide you with a CPAP machine on the day of your surgery.                   You may not have any metal on your body including jewelry, and body piercing  Do not wear lotions, powders, cologne, or deodorant  Men may shave face and neck.  Contacts, Hearing Aids, dentures or bridgework may not be worn into surgery. DENTURES WILL BE REMOVED PRIOR TO SURGERY PLEASE DO NOT APPLY "Poly grip" OR ADHESIVES!!!  You may bring a small overnight bag with you on the day of surgery, only pack items that are not valuable. [facility] IS NOT RESPONSIBLE   FOR VALUABLES THAT ARE LOST OR STOLEN.   Do not  bring your home medications to the hospital. The Pharmacy will dispense medications listed on your medication list to you during your admission in the Hospital.  Special Instructions: Bring a copy of your healthcare power of attorney and living will documents the day of surgery, if you wish to have them scanned into your [place] Medical Records- EPIC  Please read over the following fact sheets you were given: IF YOU HAVE QUESTIONS ABOUT YOUR PRE-OP INSTRUCTIONS, PLEASE CALL [phone].     Pre-operative 5 CHG Bath Instructions   You can play a key role in reducing the risk of infection after surgery. Your skin needs to be as free of germs as possible. You can reduce the number of germs on your skin by washing with CHG (chlorhexidine gluconate) soap before surgery. CHG is an antiseptic soap that kills germs and continues to kill germs even after washing.   DO NOT use if you have an allergy to chlorhexidine/CHG or antibacterial soaps. If your skin becomes reddened or irritated, stop using the CHG and notify one of our RNs at [phone]  Please shower with the CHG soap starting 4 days before surgery using the following schedule: START SHOWERS ON  MONDAY  March 17, 2023  Please keep in mind the following:  DO NOT shave, including legs and underarms, starting the day of your first shower.   You may shave your face at any point before/day of surgery.   Place clean sheets on your bed the day you start using CHG soap. Use a clean washcloth (not used since being washed) for each shower. DO NOT sleep with pets once you start using the CHG.   CHG Shower Instructions:  If you choose to wash your hair and private area, wash first with your normal shampoo/soap.  After you use shampoo/soap, rinse your hair and body  thoroughly to remove shampoo/soap residue.  Turn the water OFF and apply about 3 tablespoons (45 ml) of CHG soap to a CLEAN washcloth.  Apply CHG soap ONLY FROM YOUR NECK DOWN TO YOUR TOES (washing for 3-5 minutes)  DO NOT use CHG soap on face, private areas, open wounds, or sores.  Pay special attention to the area where your surgery is being performed.  If you are having back surgery, having someone wash your back for you may be helpful.  Wait 2 minutes after CHG soap is applied, then you may rinse off the CHG soap.  Pat dry with a clean towel  Put on clean clothes/pajamas   If you choose to wear lotion, please use ONLY the CHG-compatible lotions on the back of this paper.     Additional instructions for the day of surgery: DO NOT APPLY any lotions, deodorants, cologne, or perfumes.   Put on clean/comfortable clothes.  Brush your teeth.  Ask your nurse before applying any prescription medications to the skin.      CHG Compatible Lotions   Aveeno Moisturizing lotion  Cetaphil Moisturizing Cream  Cetaphil Moisturizing Lotion  Clairol Herbal Essence Moisturizing Lotion, Dry Skin  Clairol Herbal Essence Moisturizing Lotion, Extra Dry Skin  Clairol Herbal Essence Moisturizing Lotion, Normal Skin  Curel Age Defying Therapeutic Moisturizing Lotion with Alpha Hydroxy  Curel Extreme Care Body Lotion  Curel Soothing Hands Moisturizing Hand Lotion  Curel Therapeutic Moisturizing Cream, Fragrance-Free  Curel Therapeutic Moisturizing Lotion, Fragrance-Free  Curel Therapeutic Moisturizing Lotion, Original Formula  Eucerin Daily Replenishing Lotion  Eucerin Dry Skin Therapy Plus Alpha Hydroxy Crme  Eucerin Dry Skin Therapy Plus Alpha Hydroxy Lotion  Eucerin Original Crme  Eucerin Original Lotion  Eucerin Plus Crme Eucerin Plus Lotion  Eucerin TriLipid Replenishing Lotion  Keri Anti-Bacterial Hand Lotion  Keri Deep Conditioning Original Lotion Dry Skin Formula Softly Scented  Keri  Deep Conditioning Original Lotion, Fragrance Free Sensitive Skin Formula  Keri Lotion Fast Absorbing Fragrance Free Sensitive Skin Formula  Keri Lotion Fast Absorbing Softly Scented Dry Skin Formula  Keri Original Lotion  Keri Skin Renewal Lotion Keri Silky Smooth Lotion  Keri Silky Smooth Sensitive Skin Lotion  Nivea Body Creamy Conditioning Oil  Nivea Body Extra Enriched Lotion  Nivea Body Original Lotion  Nivea Body Sheer Moisturizing Lotion Nivea Crme  Nivea Skin Firming Lotion  NutraDerm 30 Skin Lotion  NutraDerm Skin Lotion  NutraDerm Therapeutic Skin Cream  NutraDerm Therapeutic Skin Lotion  ProShield Protective Hand Cream  Provon moisturizing lotion   FAILURE TO FOLLOW THESE INSTRUCTIONS MAY RESULT IN THE CANCELLATION OF YOUR SURGERY  PATIENT SIGNATURE_________________________________  NURSE SIGNATURE__________________________________  ________________________________________________________________________     Sean Parks    An incentive spirometer is a tool that can help keep your lungs clear and active. This tool measures how well you are filling your lungs with each breath. Taking long deep  breaths may help reverse or decrease the chance of developing breathing (pulmonary) problems (especially infection) following: A long period of time when you are unable to move or be active. BEFORE THE PROCEDURE  If the spirometer includes an indicator to show your best effort, your nurse or respiratory therapist will set it to a desired goal. If possible, sit up straight or lean slightly forward. Try not to slouch. Hold the incentive spirometer in an upright position. INSTRUCTIONS FOR USE  Sit on the edge of your bed if possible, or sit up as far as you can in bed or on a chair. Hold the incentive spirometer in an upright position. Breathe out normally. Place the mouthpiece in your mouth and seal your lips tightly around it. Breathe in slowly and as deeply as  possible, raising the piston or the ball toward the top of the column. Hold your breath for 3-5 seconds or for as long as possible. Allow the piston or ball to fall to the bottom of the column. Remove the mouthpiece from your mouth and breathe out normally. Rest for a few seconds and repeat Steps 1 through 7 at least 10 times every 1-2 hours when you are awake. Take your time and take a few normal breaths between deep breaths. The spirometer may include an indicator to show your best effort. Use the indicator as a goal to work toward during each repetition. After each set of 10 deep breaths, practice coughing to be sure your lungs are clear. If you have an incision (the cut made at the time of surgery), support your incision when coughing by placing a pillow or rolled up towels firmly against it. Once you are able to get out of bed, walk around indoors and cough well. You may stop using the incentive spirometer when instructed by your caregiver.  RISKS AND COMPLICATIONS Take your time so you do not get dizzy or light-headed. If you are in pain, you may need to take or ask for pain medication before doing incentive spirometry. It is harder to take a deep breath if you are having pain. AFTER USE Rest and breathe slowly and easily. It can be helpful to keep track of a log of your progress. Your caregiver can provide you with a simple table to help with this. If you are using the spirometer at home, follow these instructions: SEEK MEDICAL CARE IF:  You are having difficultly using the spirometer. You have trouble using the spirometer as often as instructed. Your pain medication is not giving enough relief while using the spirometer. You develop fever of 100.5 F (38.1 C) or higher.                                                                                                    SEEK IMMEDIATE MEDICAL CARE IF:  You cough up bloody sputum that had not been present before. You develop fever of 102 F  (38.9 C) or greater. You develop worsening pain at or near the incision site. MAKE SURE YOU:  Understand these instructions. Will watch your condition. Will get  help right away if you are not doing well or get worse. Document Released: 08/12/2006 Document Revised: 06/24/2011 Document Reviewed: 10/13/2006 [facility] Patient Information 2014 [place], [place].    WHAT IS A BLOOD TRANSFUSION? Blood Transfusion Information  A transfusion is the replacement of blood or some of its parts. Blood is made up of multiple cells which provide different functions. Red blood cells carry oxygen and are used for blood loss replacement. White blood cells fight against infection. Platelets control bleeding. Plasma helps clot blood. Other blood products are available for specialized needs, such as hemophilia or other clotting disorders. BEFORE THE TRANSFUSION  Who gives blood for transfusions?  Healthy volunteers who are fully evaluated to make sure their blood is safe. This is blood bank blood. Transfusion therapy is the safest it has ever been in the practice of medicine. Before blood is taken from a donor, a complete history is taken to make sure that person has no history of diseases nor engages in risky social behavior (examples are intravenous drug use or sexual activity with multiple partners). The donor's travel history is screened to minimize risk of transmitting infections, such as malaria. The donated blood is tested for signs of infectious diseases, such as HIV and hepatitis. The blood is then tested to be sure it is compatible with you in order to minimize the chance of a transfusion reaction. If you or a relative donates blood, this is often done in anticipation of surgery and is not appropriate for emergency situations. It takes many days to process the donated blood. RISKS AND COMPLICATIONS Although transfusion therapy is very safe and saves many lives, the main dangers of transfusion include:   Getting an infectious disease. Developing a transfusion reaction. This is an allergic reaction to something in the blood you were given. Every precaution is taken to prevent this. The decision to have a blood transfusion has been considered carefully by your caregiver before blood is given. Blood is not given unless the benefits outweigh the risks. AFTER THE TRANSFUSION Right after receiving a blood transfusion, you will usually feel much better and more energetic. This is especially true if your red blood cells have gotten low (anemic). The transfusion raises the level of the red blood cells which carry oxygen, and this usually causes an energy increase. The nurse administering the transfusion will monitor you carefully for complications. HOME CARE INSTRUCTIONS  No special instructions are needed after a transfusion. You may find your energy is better. Speak with your caregiver about any limitations on activity for underlying diseases you may have. SEEK MEDICAL CARE IF:  Your condition is not improving after your transfusion. You develop redness or irritation at the intravenous (IV) site. SEEK IMMEDIATE MEDICAL CARE IF:  Any of the following symptoms occur over the next 12 hours: Shaking chills. You have a temperature by mouth above 102 F (38.9 C), not controlled by medicine. Chest, back, or muscle pain. People around you feel you are not acting correctly or are confused. Shortness of breath or difficulty breathing. Dizziness and fainting. You get a rash or develop hives. You have a decrease in urine output. Your urine turns a dark color or changes to pink, red, or brown. Any of the following symptoms occur over the next 10 days: You have a temperature by mouth above 102 F (38.9 C), not controlled by medicine. Shortness of breath. Weakness after normal activity. The white part of the eye turns yellow (jaundice). You have a decrease in the amount of  urine or are urinating less  often. Your urine turns a dark color or changes to pink, red, or brown. Document Released: 03/29/2000 Document Revised: 06/24/2011 Document Reviewed: 11/16/2007 [facility] Patient Information 2014 [place], [place].  _______________________________________________________________________

## 2023-03-10 NOTE — Progress Notes (Signed)
COVID Vaccine received:  []  No [x]  Yes Date of any COVID positive Test in last 90 days:  PCP - Dr. Christena Flake   at [facility]  [place] [phone] (Work) [phone] (Fax)   Cardiologist - Iran Planas, MD at [facility], [phone] (Work)  [phone] (Fax) Cardiology clearance in 02-21-2023 office note.   Oncology- [facility] in [place], [place]  Chest x-ray -10-20-2013  2v  [facility] done at [place]. EKG -  02-21-2023  [facility]  requested from [facility] Cardiology Stress Test - 09-18-2021  ECHO - 03-01-2022   [facility] Cardiac Cath -   PCR screen: [x]  Ordered & Completed []   No Order but Needs PROFEND     []   N/A for this surgery  Surgery Plan:  []  Ambulatory   []  Outpatient in bed  [x]  Admit Anesthesia:    []  General  [x]  Spinal  []   Choice []   MAC  Pacemaker / ICD device [x]  No []  Yes   Spinal Cord Stimulator:[x]  No []  Yes       History of Sleep Apnea? []  No [x]  Yes   CPAP used?- []  No [x]  Yes    Does the patient monitor blood sugar?   [x]  N/A   []  No []  Yes  Patient has: [x]  NO Hx DM   []  Pre-DM   []  DM1  []   DM2 Last A1c was: 5.4  on 01-03-2017  CE[facility]      Blood Thinner / Instructions:None Aspirin Instructions:   None  ERAS Protocol Ordered: []  No  [x]  Yes PRE-SURGERY [x]  ENSURE  []  G2   Patient is to be NPO after: 04:15  Dental hx: []  Dentures:  []  N/A      []  Bridge or Partial:                   []  Loose or Damaged teeth:   Comments: Patient was given the 5 CHG shower / bath instructions for TKA revision surgery along with 2 bottles of the CHG soap. Patient will start this on: Monday 03-17-2023  All questions were asked and answered, Patient voiced understanding of this process.   Activity level: Patient is able / unable to climb a flight of stairs without difficulty; []  No CP  []  No SOB, but would have ___   Patient can / can not perform ADLs without assistance.   Anesthesia review: hx CLL, OSA-CPAP, heart  murmur (moderate AS on ECHO 02-19-2022, Not heard at 02-21-23 Cardiology appt-  EKG that day showed  a new left anterior fascicular block when compared to 01-03-2017 EKG in [facility]. Asthma, HOH- has HAs but only wears occasionally,   Patient denies shortness of breath, fever, cough and chest pain at PAT appointment.  Patient verbalized understanding and agreement to the Pre-Surgical Instructions that were given to them at this PAT appointment. Patient was also educated of the need to review these PAT instructions again prior to his surgery.I reviewed the appropriate phone numbers to call if they have any and questions or concerns.

## 2023-03-11 ENCOUNTER — Encounter (HOSPITAL_COMMUNITY): Payer: Self-pay

## 2023-03-11 ENCOUNTER — Other Ambulatory Visit: Payer: Self-pay

## 2023-03-11 ENCOUNTER — Encounter (HOSPITAL_COMMUNITY)
Admission: RE | Admit: 2023-03-11 | Discharge: 2023-03-11 | Disposition: A | Discharge status: Home / Self Care | Payer: Medicare Other | Source: Ambulatory Visit | Attending: Orthopedic Surgery | Admitting: Orthopedic Surgery

## 2023-03-11 VITALS — BP 124/62 | HR 82 | Temp 97.7°F | Resp 20 | Ht 72.0 in | Wt 283.0 lb

## 2023-03-11 DIAGNOSIS — M25562 Pain in left knee: Secondary | ICD-10-CM | POA: Diagnosis not present

## 2023-03-11 DIAGNOSIS — Z01812 Encounter for preprocedural laboratory examination: Secondary | ICD-10-CM | POA: Diagnosis present

## 2023-03-11 DIAGNOSIS — C9110 Chronic lymphocytic leukemia of B-cell type not having achieved remission: Secondary | ICD-10-CM | POA: Insufficient documentation

## 2023-03-11 DIAGNOSIS — Z01818 Encounter for other preprocedural examination: Secondary | ICD-10-CM

## 2023-03-11 HISTORY — DX: Unspecified asthma, uncomplicated: J45.909

## 2023-03-11 HISTORY — DX: Pneumonia, unspecified organism: J18.9

## 2023-03-11 HISTORY — DX: Unspecified hearing loss, unspecified ear: H91.90

## 2023-03-11 LAB — CBC WITH DIFFERENTIAL/PLATELET
Abs Immature Granulocytes: 0.07 10*3/uL (ref 0.00–0.07)
Basophils Absolute: 0.1 10*3/uL (ref 0.0–0.1)
Basophils Relative: 0 %
Eosinophils Absolute: 0.1 10*3/uL (ref 0.0–0.5)
Eosinophils Relative: 1 %
HCT: 44 % (ref 39.0–52.0)
Hemoglobin: 14.5 g/dL (ref 13.0–17.0)
Immature Granulocytes: 1 %
Lymphocytes Relative: 46 %
Lymphs Abs: 5.2 10*3/uL — ABNORMAL HIGH (ref 0.7–4.0)
MCH: 29.8 pg (ref 26.0–34.0)
MCHC: 33 g/dL (ref 30.0–36.0)
MCV: 90.5 fL (ref 80.0–100.0)
Monocytes Absolute: 0.6 10*3/uL (ref 0.1–1.0)
Monocytes Relative: 5 %
Neutro Abs: 5.3 10*3/uL (ref 1.7–7.7)
Neutrophils Relative %: 47 %
Platelets: 142 10*3/uL — ABNORMAL LOW (ref 150–400)
RBC: 4.86 MIL/uL (ref 4.22–5.81)
RDW: 13.2 % (ref 11.5–15.5)
WBC: 11.4 10*3/uL — ABNORMAL HIGH (ref 4.0–10.5)
nRBC: 0 % (ref 0.0–0.2)

## 2023-03-11 LAB — SURGICAL PCR SCREEN
MRSA, PCR: NEGATIVE
Staphylococcus aureus: NEGATIVE

## 2023-03-11 LAB — COMPREHENSIVE METABOLIC PANEL
ALT: 22 U/L (ref 0–44)
AST: 22 U/L (ref 15–41)
Albumin: 4.5 g/dL (ref 3.5–5.0)
Alkaline Phosphatase: 86 U/L (ref 38–126)
Anion gap: 15 (ref 5–15)
BUN: 18 mg/dL (ref 8–23)
CO2: 26 mmol/L (ref 22–32)
Calcium: 9.6 mg/dL (ref 8.9–10.3)
Chloride: 98 mmol/L (ref 98–111)
Creatinine, Ser: 0.75 mg/dL (ref 0.61–1.24)
GFR, Estimated: >60 mL/min (ref >60)
Glucose, Bld: 91 mg/dL (ref 70–99)
Potassium: 4.2 mmol/L (ref 3.5–5.1)
Sodium: 139 mmol/L (ref 135–145)
Total Bilirubin: 1 mg/dL (ref <1.2)
Total Protein: 7.4 g/dL (ref 6.5–8.1)

## 2023-03-12 NOTE — Progress Notes (Addendum)
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 never smoker with h/o sleep apnea with CPAP, CLL, moderate AS, left knee mechanical loosening scheduled for above procedure 03/21/2023 with Dr. Dannielle Huh.   Pt seen by cardiology 02/21/2023 for preoperative evaluation. Pt remains asymptomatic. Pt had a stress echo for his last procedure and he achieved 4.6 METS after going 3 minutes on a Bruce protocol. The test showed no evidence of ischemia. Per OV note, "the patient is at low risk for the planned procedure. He is on no cardiac medications. Please not hesitate to call me if you have any questions."  Echo 03/01/2022 with moderate AS, mean gradient 38.5 mmHg, AVA 0.74 cm2.   Discussed with Dr. Tacy Dura, ok to proceed. Pt asymptomatic and had a spinal with surgery in January.  VS: BP 124/62 Comment: right arm sitting  Pulse 82   Temp 36.5 C (Oral)   Resp 20   Ht 6' (1.829 m)   Wt 128.4 kg   SpO2 100%   BMI 38.38 kg/m   PROVIDERS: Christena Flake, MD is PCP   Iran Planas, MD is Cardiologist  LABS: Labs reviewed: Acceptable for surgery. (all labs ordered are listed, but only abnormal results are displayed)  Labs Reviewed  CBC WITH DIFFERENTIAL/PLATELET - Abnormal; Notable for the following components:      Result Value   WBC 11.4 (*)    Platelets 142 (*)    Lymphs Abs 5.2 (*)    All other components within normal limits  SURGICAL PCR SCREEN  COMPREHENSIVE METABOLIC PANEL  TYPE AND SCREEN     IMAGES:   EKG:   CV: Echo 03/01/2022 (Care Everywhere) Summary   1. Overall left ventricular ejection fraction is estimated at 60 to 65%.   2. Normal global left ventricular systolic function.   3. (Grade 1) Mildly abnormal left  ventricular diastolic filling.   4. Mild mitral annular calcification.   5. No evidence of and mild mitral stenosis.   6. Mild to moderate aortic regurgitation.   7. Moderate aortic stenosis.   8. There is moderate aortic valve sclerosis.  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   Heart murmur    HOH (hard of hearing)    no HAs   Pneumonia    Sleep apnea    Wears CPAP    Past Surgical History:  Procedure Laterality Date   bone spurs   04/15/2010   Removed from L shoulder   COLONOSCOPY W/ BIOPSIES     FRACTURE SURGERY     L foot when pt was 67 years old   LYMPH NODE BIOPSY     right hip repolacement      TOTAL KNEE ARTHROPLASTY Left 03/01/2013   Procedure: TOTAL KNEE ARTHROPLASTY;  Surgeon: Dannielle Huh, MD;  Location: [facility];  Service: Orthopedics;  Laterality: Left;   TOTAL KNEE ARTHROPLASTY Right 04/19/2022   Procedure: TOTAL KNEE ARTHROPLASTY;  Surgeon: Dannielle Huh, MD;  Location: [facility];  Service: Orthopedics;  Laterality: Right;    MEDICATIONS:  aspirin 81 MG chewable tablet   methocarbamol (ROBAXIN) 500 MG  tablet   oxyCODONE (ROXICODONE) 5 MG immediate release tablet   No current facility-administered medications for this encounter.     Jodell Cipro Ward, PA-C [facility] Pre-Surgical Testing [phone]

## 2023-03-12 NOTE — Anesthesia Preprocedure Evaluation (Addendum)
Anesthesia Evaluation  Patient identified by MRN, date of birth, ID band Patient awake    Reviewed: Allergy & Precautions, NPO status , Patient's Chart, lab work & pertinent test results  History of Anesthesia Complications Negative for: history of anesthetic complications  Airway Mallampati: II  TM Distance: >3 FB Neck ROM: Full    Dental  (+) Dental Advisory Given, Caps   Pulmonary asthma , sleep apnea and Continuous Positive Airway Pressure Ventilation    breath sounds clear to auscultation       Cardiovascular + Valvular Problems/Murmurs AS  Rhythm:Regular Rate:Normal + Systolic murmurs 40/9811 ECHO: EF 60-65%, normal LVF, Grade 1 DD, mod AS, mean gradient 38.5 mmHg, mild-mod AI   Neuro/Psych negative neurological ROS     GI/Hepatic negative GI ROS, Neg liver ROS,,,  Endo/Other  BMI 37.8  Renal/GU negative Renal ROS     Musculoskeletal  (+) Arthritis ,    Abdominal   Peds  Hematology H/o CLL   Anesthesia Other Findings   Reproductive/Obstetrics                             Anesthesia Physical Anesthesia Plan  ASA: 4  Anesthesia Plan: General   Post-op Pain Management: Regional block* and Tylenol PO (pre-op)*   Induction: Intravenous  PONV Risk Score and Plan: 2  Airway Management Planned: LMA  Additional Equipment: None  Intra-op Plan:   Post-operative Plan:   Informed Consent: I have reviewed the patients History and Physical, chart, labs and discussed the procedure including the risks, benefits and alternatives for the proposed anesthesia with the patient or authorized representative who has indicated his/her understanding and acceptance.     Dental advisory given  Plan Discussed with: CRNA and Surgeon  Anesthesia Plan Comments: (See PAT note 03/11/2023 Pt with mod AS by ECHO 1 year ago, with mean gradient 38 mmHg, AVA 0.74 cm2, given natural history of AS  progression, will plan GA with adductor canal block for post op analgesia)       Anesthesia Quick Evaluation

## 2023-03-21 ENCOUNTER — Other Ambulatory Visit: Payer: Self-pay

## 2023-03-21 ENCOUNTER — Encounter (HOSPITAL_COMMUNITY)
Admission: RE | Disposition: A | Discharge status: Home / Self Care | Payer: Self-pay | Source: Ambulatory Visit | Attending: Orthopedic Surgery

## 2023-03-21 ENCOUNTER — Inpatient Hospital Stay (HOSPITAL_COMMUNITY): Payer: Medicare Other | Admitting: Physician Assistant

## 2023-03-21 ENCOUNTER — Inpatient Hospital Stay (HOSPITAL_COMMUNITY)
Admission: RE | Admit: 2023-03-21 | Discharge: 2023-03-21 | DRG: 467 | Disposition: A | Discharge status: Home / Self Care | Payer: Medicare Other | Attending: Orthopedic Surgery | Admitting: Orthopedic Surgery

## 2023-03-21 ENCOUNTER — Inpatient Hospital Stay (HOSPITAL_COMMUNITY): Payer: Medicare Other | Admitting: Anesthesiology

## 2023-03-21 ENCOUNTER — Encounter (HOSPITAL_COMMUNITY): Payer: Self-pay | Admitting: Orthopedic Surgery

## 2023-03-21 DIAGNOSIS — H9190 Unspecified hearing loss, unspecified ear: Secondary | ICD-10-CM | POA: Diagnosis present

## 2023-03-21 DIAGNOSIS — S82142A Displaced bicondylar fracture of left tibia, initial encounter for closed fracture: Secondary | ICD-10-CM | POA: Diagnosis present

## 2023-03-21 DIAGNOSIS — T84033A Mechanical loosening of internal left knee prosthetic joint, initial encounter: Secondary | ICD-10-CM

## 2023-03-21 DIAGNOSIS — I2510 Atherosclerotic heart disease of native coronary artery without angina pectoris: Secondary | ICD-10-CM | POA: Diagnosis present

## 2023-03-21 DIAGNOSIS — Z96652 Presence of left artificial knee joint: Secondary | ICD-10-CM | POA: Diagnosis present

## 2023-03-21 DIAGNOSIS — Z856 Personal history of leukemia: Secondary | ICD-10-CM | POA: Diagnosis not present

## 2023-03-21 DIAGNOSIS — Z01818 Encounter for other preprocedural examination: Secondary | ICD-10-CM

## 2023-03-21 DIAGNOSIS — Y792 Prosthetic and other implants, materials and accessory orthopedic devices associated with adverse incidents: Secondary | ICD-10-CM | POA: Diagnosis present

## 2023-03-21 HISTORY — PX: TOTAL KNEE REVISION: SHX996

## 2023-03-21 LAB — TYPE AND SCREEN
ABO/RH(D): O POS
ABO/RH(D): O POS
Antibody Screen: NEGATIVE
Antibody Screen: NEGATIVE

## 2023-03-21 SURGERY — TOTAL KNEE REVISION
Anesthesia: General | Site: Knee | Laterality: Left

## 2023-03-21 MED ORDER — ASPIRIN 81 MG PO CHEW: 81.0000 mg | CHEWABLE_TABLET | Freq: Two times a day (BID) | ORAL | 0 refills | Status: AC

## 2023-03-21 MED ORDER — OXYCODONE HCL 5 MG/5ML PO SOLN: 5.0000 mg | Freq: Once | ORAL | Status: AC | PRN

## 2023-03-21 MED ORDER — METHOCARBAMOL 500 MG PO TABS
500.0000 mg | ORAL_TABLET | Freq: Four times a day (QID) | ORAL | 0 refills | Status: AC
Start: 2023-03-21 — End: ?

## 2023-03-21 MED ORDER — ACETAMINOPHEN 500 MG PO TABS: 1000.0000 mg | ORAL_TABLET | Freq: Once | ORAL | Status: DC

## 2023-03-21 MED ORDER — STERILE WATER FOR IRRIGATION IR SOLN
Status: DC | PRN
  Administered 2023-03-21: 1000 mL

## 2023-03-21 MED ORDER — PHENYLEPHRINE 80 MCG/ML (10ML) SYRINGE FOR IV PUSH (FOR BLOOD PRESSURE SUPPORT)
PREFILLED_SYRINGE | INTRAVENOUS | Status: AC
  Filled 2023-03-21: qty 10

## 2023-03-21 MED ORDER — PROPOFOL 10 MG/ML IV BOLUS
INTRAVENOUS | Status: DC | PRN
  Administered 2023-03-21: 200 mg via INTRAVENOUS

## 2023-03-21 MED ORDER — FENTANYL CITRATE (PF) 100 MCG/2ML IJ SOLN
INTRAMUSCULAR | Status: DC | PRN
  Administered 2023-03-21 (×3): 50 ug via INTRAVENOUS

## 2023-03-21 MED ORDER — BUPIVACAINE LIPOSOME 1.3 % IJ SUSP: 20.0000 mL | Freq: Once | INTRAMUSCULAR | Status: DC

## 2023-03-21 MED ORDER — TRANEXAMIC ACID-NACL 1000-0.7 MG/100ML-% IV SOLN
1000.0000 mg | INTRAVENOUS | Status: AC
  Administered 2023-03-21: 1000 mg via INTRAVENOUS
  Filled 2023-03-21: qty 100

## 2023-03-21 MED ORDER — MIDAZOLAM HCL 5 MG/5ML IJ SOLN
INTRAMUSCULAR | Status: DC | PRN
  Administered 2023-03-21 (×2): 1 mg via INTRAVENOUS

## 2023-03-21 MED ORDER — PHENYLEPHRINE HCL-NACL 20-0.9 MG/250ML-% IV SOLN
INTRAVENOUS | Status: AC
  Filled 2023-03-21: qty 250

## 2023-03-21 MED ORDER — SODIUM CHLORIDE (PF) 0.9 % IJ SOLN
INTRAMUSCULAR | Status: AC
  Filled 2023-03-21: qty 20

## 2023-03-21 MED ORDER — SODIUM CHLORIDE 0.9 % IV SOLN
INTRAVENOUS | Status: DC | PRN
  Administered 2023-03-21: 70 mL

## 2023-03-21 MED ORDER — BUPIVACAINE LIPOSOME 1.3 % IJ SUSP
INTRAMUSCULAR | Status: AC
  Filled 2023-03-21: qty 20

## 2023-03-21 MED ORDER — LIDOCAINE HCL (PF) 2 % IJ SOLN
INTRAMUSCULAR | Status: AC
  Filled 2023-03-21: qty 5

## 2023-03-21 MED ORDER — HYDROMORPHONE HCL 2 MG/ML IJ SOLN
INTRAMUSCULAR | Status: AC
  Filled 2023-03-21: qty 1

## 2023-03-21 MED ORDER — 0.9 % SODIUM CHLORIDE (POUR BTL) OPTIME
TOPICAL | Status: DC | PRN
  Administered 2023-03-21: 1000 mL

## 2023-03-21 MED ORDER — OXYCODONE HCL 5 MG PO TABS
5.0000 mg | ORAL_TABLET | Freq: Once | ORAL | Status: AC | PRN
  Administered 2023-03-21: 5 mg via ORAL

## 2023-03-21 MED ORDER — SODIUM CHLORIDE 0.9 % IV SOLN: INTRAVENOUS | Status: DC | PRN

## 2023-03-21 MED ORDER — FENTANYL CITRATE (PF) 100 MCG/2ML IJ SOLN
INTRAMUSCULAR | Status: AC
  Filled 2023-03-21: qty 2

## 2023-03-21 MED ORDER — LACTATED RINGERS IV SOLN: INTRAVENOUS | Status: DC | PRN

## 2023-03-21 MED ORDER — CEFAZOLIN IN SODIUM CHLORIDE 3-0.9 GM/100ML-% IV SOLN
3.0000 g | INTRAVENOUS | Status: AC
  Administered 2023-03-21: 3 g via INTRAVENOUS
  Filled 2023-03-21: qty 100

## 2023-03-21 MED ORDER — LACTATED RINGERS IV SOLN: INTRAVENOUS | Status: DC

## 2023-03-21 MED ORDER — LIDOCAINE HCL (CARDIAC) PF 100 MG/5ML IV SOSY
PREFILLED_SYRINGE | INTRAVENOUS | Status: DC | PRN
  Administered 2023-03-21: 20 mg via INTRAVENOUS

## 2023-03-21 MED ORDER — MIDAZOLAM HCL 2 MG/2ML IJ SOLN
INTRAMUSCULAR | Status: AC
  Filled 2023-03-21: qty 2

## 2023-03-21 MED ORDER — OXYCODONE HCL 5 MG PO TABS
ORAL_TABLET | ORAL | Status: AC
  Filled 2023-03-21: qty 1

## 2023-03-21 MED ORDER — HYDROMORPHONE HCL 1 MG/ML IJ SOLN: 0.2500 mg | INTRAMUSCULAR | Status: DC | PRN

## 2023-03-21 MED ORDER — ACETAMINOPHEN 500 MG PO TABS
1000.0000 mg | ORAL_TABLET | Freq: Once | ORAL | Status: AC
Start: 2023-03-21 — End: 2023-03-21
  Administered 2023-03-21: 1000 mg via ORAL
  Filled 2023-03-21: qty 2

## 2023-03-21 MED ORDER — KETOROLAC TROMETHAMINE 30 MG/ML IJ SOLN
INTRAMUSCULAR | Status: AC
  Filled 2023-03-21: qty 1

## 2023-03-21 MED ORDER — POVIDONE-IODINE 10 % EX SWAB: 2.0000 | Freq: Once | CUTANEOUS | Status: DC

## 2023-03-21 MED ORDER — CHLORHEXIDINE GLUCONATE 0.12 % MT SOLN
15.0000 mL | Freq: Once | OROMUCOSAL | Status: AC
  Administered 2023-03-21: 15 mL via OROMUCOSAL

## 2023-03-21 MED ORDER — ROPIVACAINE HCL 7.5 MG/ML IJ SOLN
INTRAMUSCULAR | Status: DC | PRN
  Administered 2023-03-21: 20 mL via PERINEURAL

## 2023-03-21 MED ORDER — SODIUM CHLORIDE 0.9 % IR SOLN
Status: DC | PRN
  Administered 2023-03-21: 2000 mL

## 2023-03-21 MED ORDER — MEPERIDINE HCL 50 MG/ML IJ SOLN: 6.2500 mg | INTRAMUSCULAR | Status: DC | PRN

## 2023-03-21 MED ORDER — DEXAMETHASONE SODIUM PHOSPHATE 10 MG/ML IJ SOLN
8.0000 mg | Freq: Once | INTRAMUSCULAR | Status: AC
Start: 2023-03-21 — End: 2023-03-21
  Administered 2023-03-21: 10 mg via INTRAVENOUS

## 2023-03-21 MED ORDER — OXYCODONE HCL 5 MG PO TABS: 5.0000 mg | ORAL_TABLET | Freq: Four times a day (QID) | ORAL | 0 refills | Status: AC | PRN

## 2023-03-21 MED ORDER — ONDANSETRON HCL 4 MG/2ML IJ SOLN
INTRAMUSCULAR | Status: AC
  Filled 2023-03-21: qty 2

## 2023-03-21 MED ORDER — DEXAMETHASONE SODIUM PHOSPHATE 10 MG/ML IJ SOLN
INTRAMUSCULAR | Status: AC
  Filled 2023-03-21: qty 1

## 2023-03-21 MED ORDER — HYDROMORPHONE HCL 1 MG/ML IJ SOLN
INTRAMUSCULAR | Status: DC | PRN
Start: 2023-03-21 — End: 2023-03-21
  Administered 2023-03-21: .1 mg via INTRAVENOUS
  Administered 2023-03-21 (×3): .2 mg via INTRAVENOUS
  Administered 2023-03-21 (×2): .1 mg via INTRAVENOUS
  Administered 2023-03-21: 1.1 mg via INTRAVENOUS

## 2023-03-21 MED ORDER — BUPIVACAINE-EPINEPHRINE 0.25% -1:200000 IJ SOLN
INTRAMUSCULAR | Status: AC
  Filled 2023-03-21: qty 1

## 2023-03-21 MED ORDER — ORAL CARE MOUTH RINSE: 15.0000 mL | Freq: Once | OROMUCOSAL | Status: AC

## 2023-03-21 MED ORDER — PHENYLEPHRINE HCL-NACL 20-0.9 MG/250ML-% IV SOLN
INTRAVENOUS | Status: DC | PRN
  Administered 2023-03-21: 40 ug/min via INTRAVENOUS

## 2023-03-21 MED ORDER — ONDANSETRON HCL 4 MG/2ML IJ SOLN
INTRAMUSCULAR | Status: DC | PRN
  Administered 2023-03-21: 4 mg via INTRAVENOUS

## 2023-03-21 MED ORDER — PHENYLEPHRINE HCL (PRESSORS) 10 MG/ML IV SOLN
INTRAVENOUS | Status: DC | PRN
  Administered 2023-03-21 (×3): 160 ug via INTRAVENOUS

## 2023-03-21 MED ORDER — GABAPENTIN 300 MG PO CAPS
300.0000 mg | ORAL_CAPSULE | Freq: Once | ORAL | Status: AC
  Administered 2023-03-21: 300 mg via ORAL
  Filled 2023-03-21: qty 1

## 2023-03-21 MED ORDER — MIDAZOLAM HCL 2 MG/2ML IJ SOLN: 0.5000 mg | Freq: Once | INTRAMUSCULAR | Status: DC | PRN

## 2023-03-21 MED ORDER — PROPOFOL 10 MG/ML IV BOLUS
INTRAVENOUS | Status: AC
  Filled 2023-03-21: qty 20

## 2023-03-21 SURGICAL SUPPLY — 54 items
AUG TIB HALF WEDGE EF 15 LM (Joint) IMPLANT
BAG COUNTER SPONGE SURGICOUNT (BAG) IMPLANT
BAG ZIPLOCK 12X15 (MISCELLANEOUS) ×1 IMPLANT
BLADE SAGITTAL 13X1.27X60 (BLADE) IMPLANT
BLADE SAW RECIP 87.9 MT (BLADE) IMPLANT
BLADE SAW SGTL 81X20 HD (BLADE) IMPLANT
BLADE SAW SGTL 83.5X18.5 (BLADE) IMPLANT
BLADE SURG SZ10 CARB STEEL (BLADE) ×4 IMPLANT
BNDG ELASTIC 6X15 VLCR STRL LF (GAUZE/BANDAGES/DRESSINGS) ×1 IMPLANT
BOWL SMART MIX CTS (DISPOSABLE) IMPLANT
CEMENT BONE SIMPLEX TOBRAMYCIN (Cement) IMPLANT
COMPONENT TIB KNEE SZ E UNC (Knees) IMPLANT
COVER SURGICAL LIGHT HANDLE (MISCELLANEOUS) ×1 IMPLANT
CUFF TRNQT CYL 34X4.125X (TOURNIQUET CUFF) IMPLANT
DRAPE INCISE IOBAN 66X45 STRL (DRAPES) ×2 IMPLANT
DRAPE U-SHAPE 47X51 STRL (DRAPES) ×1 IMPLANT
DRSG AQUACEL AG ADV 3.5X10 (GAUZE/BANDAGES/DRESSINGS) ×1 IMPLANT
DRSG AQUACEL AG ADV 3.5X14 (GAUZE/BANDAGES/DRESSINGS) IMPLANT
DURAPREP 26ML APPLICATOR (WOUND CARE) ×2 IMPLANT
ELECT REM PT RETURN 15FT ADLT (MISCELLANEOUS) ×1 IMPLANT
GLOVE BIOGEL PI IND STRL 7.5 (GLOVE) IMPLANT
GLOVE BIOGEL PI IND STRL 8.5 (GLOVE) ×1 IMPLANT
GLOVE SURG LX STRL 7.5 STRW (GLOVE) IMPLANT
GLOVE SURG ORTHO 8.0 STRL STRW (GLOVE) ×2 IMPLANT
GOWN STRL REUS W/ TWL XL LVL3 (GOWN DISPOSABLE) ×2 IMPLANT
HOLDER FOLEY CATH W/STRAP (MISCELLANEOUS) IMPLANT
HOOD PEEL AWAY T7 (MISCELLANEOUS) ×3 IMPLANT
INSERT TIB  KNEE EF/7-9 22 LT (Insert) IMPLANT
KIT TURNOVER KIT A (KITS) IMPLANT
MANIFOLD NEPTUNE II (INSTRUMENTS) ×1 IMPLANT
NDL HYPO 22X1.5 SAFETY MO (MISCELLANEOUS) ×1 IMPLANT
NEEDLE HYPO 22X1.5 SAFETY MO (MISCELLANEOUS) ×1 IMPLANT
NS IRRIG 1000ML POUR BTL (IV SOLUTION) ×1 IMPLANT
PACK TOTAL KNEE CUSTOM (KITS) ×1 IMPLANT
PROSTHESIS FEM CMT STD SZ9 LT (Miscellaneous) IMPLANT
PROTECTOR NERVE ULNAR (MISCELLANEOUS) ×1 IMPLANT
SET HNDPC FAN SPRY TIP SCT (DISPOSABLE) ×1 IMPLANT
SPIKE FLUID TRANSFER (MISCELLANEOUS) IMPLANT
SPONGE T-LAP 18X18 ~~LOC~~+RFID (SPONGE) IMPLANT
STAPLER SKIN PROX WIDE 3.9 (STAPLE) IMPLANT
STEM EXT REV 3 20 +135 (Stem) IMPLANT
STEM FEM PERS 15 +135 (Stem) IMPLANT
STRIP CLOSURE SKIN 1/2X4 (GAUZE/BANDAGES/DRESSINGS) ×1 IMPLANT
SUT BONE WAX W31G (SUTURE) ×1 IMPLANT
SUT MNCRL AB 3-0 PS2 18 (SUTURE) ×1 IMPLANT
SUT STRATAFIX 0 PDS 27 VIOLET (SUTURE) ×1 IMPLANT
SUT STRATAFIX PDS+ 0 24IN (SUTURE) ×1 IMPLANT
SUT VIC AB 1 CT1 36 (SUTURE) ×1 IMPLANT
SUTURE STRATFX 0 PDS 27 VIOLET (SUTURE) ×1 IMPLANT
TRAY FOLEY MTR SLVR 16FR STAT (SET/KITS/TRAYS/PACK) ×1 IMPLANT
TUBE SUCTION HIGH CAP CLEAR NV (SUCTIONS) IMPLANT
WATER STERILE IRR 1000ML POUR (IV SOLUTION) ×1 IMPLANT
WEDGE FEM F/ARTHRO 9X5 (Miscellaneous) IMPLANT
WRAP KNEE MAXI GEL POST OP (GAUZE/BANDAGES/DRESSINGS) IMPLANT

## 2023-03-21 NOTE — Anesthesia Procedure Notes (Signed)
Procedure Name: LMA Insertion Date/Time: 03/21/2023 7:30 AM  Performed by: Lily Lovings, CRNAPre-anesthesia Checklist: Patient identified, Patient being monitored, Timeout performed, Emergency Drugs available and Suction available Patient Re-evaluated:Patient Re-evaluated prior to induction Oxygen Delivery Method: Circle system utilized Preoxygenation: Pre-oxygenation with 100% oxygen Induction Type: IV induction Ventilation: Mask ventilation without difficulty LMA: LMA inserted LMA Size: 5.0 Tube type: Oral Number of attempts: 1 Placement Confirmation: positive ETCO2 and breath sounds checked- equal and bilateral Tube secured with: Tape Dental Injury: Teeth and Oropharynx as per pre-operative assessment

## 2023-03-21 NOTE — H&P (Signed)
Sean Parks MRN:  [id number] DOB/SEX:  02-11-1956/male  CHIEF COMPLAINT:  Painful left Knee  HISTORY: Patient is a 67 y.o. male presented with a history of pain in the left knee. Onset of symptoms was abrupt starting a few month ago with gradually worsening course since that time. Patient has been treated conservatively with over-the-counter NSAIDs and activity modification. Patient currently rates pain in the knee at 10 out of 10 with activity. There is pain at night.  PAST MEDICAL HISTORY: There are no problems to display for this patient.  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   Heart murmur    HOH (hard of hearing)    no HAs   Pneumonia    Sleep apnea    Wears CPAP   Past Surgical History:  Procedure Laterality Date   bone spurs   04/15/2010   Removed from L shoulder   COLONOSCOPY W/ BIOPSIES     FRACTURE SURGERY     L foot when pt was 67 years old   LYMPH NODE BIOPSY     right hip repolacement      TOTAL KNEE ARTHROPLASTY Left 03/01/2013   Procedure: TOTAL KNEE ARTHROPLASTY;  Surgeon: Dannielle Huh, MD;  Location: [facility] OR;  Service: Orthopedics;  Laterality: Left;   TOTAL KNEE ARTHROPLASTY Right 04/19/2022   Procedure: TOTAL KNEE ARTHROPLASTY;  Surgeon: Dannielle Huh, MD;  Location: [facility];  Service: Orthopedics;  Laterality: Right;     MEDICATIONS:   Medications Prior to Admission  Medication Sig Dispense Refill Last Dose   ibuprofen (ADVIL) 200 MG tablet Take 200-600 mg by mouth every 6 (six) hours as needed for mild pain (pain score 1-3).   Past Week   aspirin 81 MG chewable tablet Chew 1 tablet (81 mg total) by mouth 2 (two) times daily. (Patient not taking: Reported on 03/07/2023) 60 tablet 0 Not Taking   methocarbamol (ROBAXIN) 500 MG tablet Take 1-2 tablets (500-1,000 mg total) by mouth 4 (four) times daily. (Patient not taking: Reported on 03/07/2023) 60 tablet 0  Not Taking   oxyCODONE (ROXICODONE) 5 MG immediate release tablet Take 1-2 tablets (5-10 mg total) by mouth every 6 (six) hours as needed. (Patient not taking: Reported on 03/07/2023) 40 tablet 0 Not Taking    ALLERGIES:  No Known Allergies  REVIEW OF SYSTEMS:  A comprehensive review of systems was negative except for: Musculoskeletal: positive for arthralgias and bone pain   FAMILY HISTORY:  History reviewed. No pertinent family history.  SOCIAL HISTORY:   Social History   Tobacco Use   Smoking status: Never   Smokeless tobacco: Never  Substance Use Topics   Alcohol use: Yes    Alcohol/week: 2.0 standard drinks of alcohol    Types: 2 Glasses of wine per week    Comment: twice a week     EXAMINATION:  Vital signs in last 24 hours: Temp:  [97.7 F (36.5 C)] 97.7 F (36.5 C) (12/06 0549) Pulse Rate:  [88] 88 (12/06 0549) Resp:  [13] 13 (12/06 0549) BP: (150)/(72) 150/72 (12/06 0549) SpO2:  [95 %] 95 % (12/06 0549) Weight:  [124.7 kg] 124.7 kg (12/06 0603)  BP (!) 150/72   Pulse 88   Temp 97.7 F (36.5 C) (Oral)   Resp 13   Ht 5' 11.5" (1.816 m)   Wt 124.7 kg   SpO2 95%  BMI 37.82 kg/m   General Appearance:    Alert, cooperative, no distress, appears stated age  Head:    Normocephalic, without obvious abnormality, atraumatic  Eyes:    PERRL, conjunctiva/corneas clear, EOM's intact, fundi    benign, both eyes       Ears:    Normal TM's and external ear canals, both ears  Nose:   Nares normal, septum midline, mucosa normal, no drainage    or sinus tenderness  Throat:   Lips, mucosa, and tongue normal; teeth and gums normal  Neck:   Supple, symmetrical, trachea midline, no adenopathy;       thyroid:  No enlargement/tenderness/nodules; no carotid   bruit or JVD  Back:     Symmetric, no curvature, ROM normal, no CVA tenderness  Lungs:     Clear to auscultation bilaterally, respirations unlabored  Chest wall:    No tenderness or deformity  Heart:    Regular rate  and rhythm, S1 and S2 normal, no murmur, rub   or gallop  Abdomen:     Soft, non-tender, bowel sounds active all four quadrants,    no masses, no organomegaly  Genitalia:    Normal male without lesion, discharge or tenderness  Rectal:    Normal tone, normal prostate, no masses or tenderness;   guaiac negative stool  Extremities:   Extremities normal, atraumatic, no cyanosis or edema  Pulses:   2+ and symmetric all extremities  Skin:   Skin color, texture, turgor normal, no rashes or lesions  Lymph nodes:   Cervical, supraclavicular, and axillary nodes normal  Neurologic:   CNII-XII intact. Normal strength, sensation and reflexes      throughout    Musculoskeletal:  ROM 0-120, Ligaments intact,  Imaging Review Plain radiographs demonstrate s/p tka with tibial fracture/loosening of the left knee. The overall alignment is mild varus. The bone quality appears to be good for age and reported activity level.  Assessment/Plan: Fracture/loosening tibial component left tka  The patient has failed conservative treatment.  The clearance notes were reviewed.  After discussion with the patient it was felt that Total Knee Revision was indicated. The procedure,  risks, and benefits of total knee revision were presented and reviewed. The risks including but not limited to aseptic loosening, infection, blood clots, vascular injury, stiffness, patella tracking problems complications among others were discussed. The patient acknowledged the explanation, agreed to proceed with the plan.  Preoperative templating of the joint replacement has been completed, documented, and submitted to the Operating Room personnel in order to optimize intra-operative equipment management.    Patient's anticipated LOS is less than 2 midnights, meeting these requirements: - Lives within 1 hour of care - Has a competent adult at home to recover with post-op recover - NO history of  - Chronic pain requiring opiods  -  Diabetes  - Coronary Artery Disease  - Heart failure  - Heart attack  - Stroke  - DVT/VTE  - Cardiac arrhythmia  - Respiratory Failure/COPD  - Renal failure  - Anemia  - Advanced Liver disease     Guy Sandifer 03/21/2023, 6:56 AM

## 2023-03-21 NOTE — Progress Notes (Signed)
Orthopedic Tech Progress Note Patient Details:  Sean Parks 30-Nov-1955 [id number]  Ortho Devices Type of Ortho Device: Bone foam zero knee Ortho Device/Splint Location: LLE Ortho Device/Splint Interventions: Application   Post Interventions Patient Tolerated: Well  Genelle Bal Brentton Wardlow 03/21/2023, 12:37 PM

## 2023-03-21 NOTE — Anesthesia Procedure Notes (Signed)
Anesthesia Regional Block: Adductor canal block   Pre-Anesthetic Checklist: , timeout performed,  Correct Patient, Correct Site, Correct Laterality,  Correct Procedure, Correct Position, site marked,  Risks and benefits discussed,  Surgical consent,  Pre-op evaluation,  At surgeon's request and post-op pain management  Laterality: Left and Lower  Prep: chloraprep       Needles:  Injection technique: Single-shot  Needle Type: Echogenic Needle     Needle Length: 9cm  Needle Gauge: 21     Additional Needles:   Procedures:,,,, ultrasound used (permanent image in chart),,    Narrative:  Start time: 03/21/2023 6:57 AM End time: 03/21/2023 7:03 AM Injection made incrementally with aspirations every 5 mL.  Performed by: Personally  Anesthesiologist: Jairo Ben, MD  Additional Notes: Pt identified in Holding room.  Monitors applied. Working IV access confirmed. Timeout, Sterile prep L thigh.  #21ga ECHOgenic Arrow block needle into adductor canal with US guidance.  20cc 0.75% Ropivacaine injected incrementally after negative test dose.  Patient asymptomatic, VSS, no heme aspirated, tolerated well.   Sandford Craze, MD

## 2023-03-21 NOTE — Anesthesia Postprocedure Evaluation (Signed)
Anesthesia Post Note  Patient: Daryl Javorsky  Procedure(s) Performed: LEFT TOTAL KNEE REVISION (Left: Knee)     Patient location during evaluation: PACU Anesthesia Type: General Level of consciousness: awake and alert, patient cooperative and oriented Pain management: pain level controlled Vital Signs Assessment: post-procedure vital signs reviewed and stable Respiratory status: spontaneous breathing, nonlabored ventilation and respiratory function stable Cardiovascular status: blood pressure returned to baseline and stable Postop Assessment: no apparent nausea or vomiting Anesthetic complications: no   No notable events documented.  Last Vitals:  Vitals:   03/21/23 1145 03/21/23 1200  BP: 124/72 125/73  Pulse: (!) 110 100  Resp: 17 18  Temp:    SpO2: 98% 93%    Last Pain:  Vitals:   03/21/23 1200  TempSrc:   PainSc: 3                  Albino Bufford,E. Jhony Antrim

## 2023-03-21 NOTE — Anesthesia Procedure Notes (Signed)
Procedure Name: MAC Date/Time: 03/21/2023 6:46 AM  Performed by: Lily Lovings, CRNAPre-anesthesia Checklist: Patient identified, Emergency Drugs available, Suction available and Patient being monitored Patient Re-evaluated:Patient Re-evaluated prior to induction Oxygen Delivery Method: Nasal cannula Preoxygenation: Pre-oxygenation with 100% oxygen

## 2023-03-21 NOTE — Evaluation (Signed)
Physical Therapy Evaluation Patient Details Name: Sean Parks MRN: [id number] DOB: 20-Mar-1956 Today's Date: 03/21/2023  History of Present Illness  67 yo male presenting to therapy s/p L TKA revision on 03/21/2023 due to periprosthetic fx secondary to fall 06/2022 failure of conservative measures. Pt PMH includes but is not limited to:  hx of CLL, OSA on CPAP, L TKA 2014 and  R TKA on 04/19/22.  Clinical Impression   Sean Parks is a 67 y.o. male POD 0 s/p L TKA. Patient reports IND with mobility at baseline. Patient is now limited by functional impairments (see PT problem list below) and requires CGA for transfers and gait with RW. Patient was able to ambulate 15 feet with RW and CGA and cues for safe walker management. Patient educated on safe sequencing for functional mobility tasks, fall risk prevention, use of RW, pain management and goals, use of CP/ice, elevate and use of bone foam and truck transfer with running board pt  and son verbalized understanding of safe guarding position for people assisting with mobility. PT verbally reviewed LE exercises to facilitate ROM and circulation with HO provided and pt instructed not to initiate HEP until cleared by orthopedic and or L incision line has ceased actively bleeding. Pt and son demonstrated verbal understanding. PT in process of removing CP and noted blood on SCD, sheets and ace bandage notified nurse whom contacted Dr. Tobin Chad PA [place]. PA provided instructions for removal of ace bandage, ted hose and dressing with nursing staff providing wound care with small area of distal lateral portion of incision line actively bleeding, incision line approximated with staples. Nursing staff provided pt and family ed on elevating L LE and use of bone foam, no CPM or HEP for the next 24 to 48 hrs. Per PA pt is cleared to d/c home today. Patient will benefit from continued skilled PT interventions to address impairments and progress towards PLOF. Patient has  met mobility goals at adequate level for discharge home with family support and OPPT therapy services; will continue to follow if pt continues acute stay to progress towards Mod I goals.       If plan is discharge home, recommend the following: A little help with walking and/or transfers;A little help with bathing/dressing/bathroom;Assistance with cooking/housework;Assist for transportation;Help with stairs or ramp for entrance   Can travel by private vehicle        Equipment Recommendations None recommended by PT  Recommendations for Other Services       Functional Status Assessment Patient has had a recent decline in their functional status and demonstrates the ability to make significant improvements in function in a reasonable and predictable amount of time.     Precautions / Restrictions Precautions Precautions: Knee;Fall Restrictions Weight Bearing Restrictions: No      Mobility  Bed Mobility Overal bed mobility: Needs Assistance Bed Mobility: Supine to Sit     Supine to sit: Min assist, HOB elevated     General bed mobility comments: min A for L LE to EOB    Transfers Overall transfer level: Needs assistance Equipment used: Rolling walker (2 wheels) Transfers: Sit to/from Stand Sit to Stand: Contact guard assist           General transfer comment: min cues    Ambulation/Gait Ambulation/Gait assistance: Contact guard assist Gait Distance (Feet): 15 Feet Assistive device: Rolling walker (2 wheels) Gait Pattern/deviations: Step-to pattern, Decreased stance time - right, Antalgic, Trunk flexed Gait velocity: decreased     General Gait  Details: gait limited due to active bleeding at incision site at time of eval step to pattern and use of B UE support at RW to offload  L LE in stance phase  Stairs            Wheelchair Mobility     Tilt Bed    Modified Rankin (Stroke Patients Only)       Balance Overall balance assessment: History of  Falls, Needs assistance Sitting-balance support: Feet supported Sitting balance-Leahy Scale: Good     Standing balance support: Bilateral upper extremity supported, During functional activity, Reliant on assistive device for balance Standing balance-Leahy Scale: Poor                               Pertinent Vitals/Pain Pain Assessment Pain Assessment: 0-10 Pain Score: 2  Pain Location: L knee and LE Pain Descriptors / Indicators: Aching, Discomfort, Dull, Operative site guarding Pain Intervention(s): Limited activity within patient's tolerance, Monitored during session, Premedicated before session, Repositioned, Ice applied    Home Living Family/patient expects to be discharged to:: Private residence Living Arrangements: Spouse/significant other Available Help at Discharge: Family;Available 24 hours/day Type of Home: House Home Access: Level entry       Home Layout: One level Home Equipment: Agricultural consultant (2 wheels);BSC/3in1;Shower seat - built in;Cane - single point Additional Comments: CPM and bone foam    Prior Function Prior Level of Function : Independent/Modified Independent;Driving;Working/employed;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, 1 fall)             Mobility Comments: IND with all ADLs, self care tasks and IADLs ADLs Comments: IND     Extremity/Trunk Assessment        Lower Extremity Assessment Lower Extremity Assessment: LLE deficits/detail LLE Deficits / Details: ankle DF/PF 5/5; SLR , 10 degree lag LLE Sensation: decreased light touch (plantar surface of L foot)    Cervical / Trunk Assessment Cervical / Trunk Assessment: Normal  Communication   Communication Communication: No apparent difficulties  Cognition Arousal: Alert Behavior During Therapy: WFL for tasks assessed/performed Overall Cognitive Status: Within Functional Limits for tasks assessed                                           General Comments      Exercises Total Joint Exercises Ankle Circles/Pumps: AROM, Both, 10 reps Quad Sets: Left, Other (comment) (verbally reviewed HO, excercise not performed) Short Arc Quad: Left, Other (comment) (verbally reviewed HO, excercise not performed) Heel Slides: Left, Other (comment) (verbally reviewed HO, excercise not performed) Hip ABduction/ADduction: Left, Other (comment) (verbally reviewed HO, excercise not performed) Straight Leg Raises: Left, Other (comment) (verbally reviewed HO, excercise not performed) Knee Flexion: Left, Seated, Other (comment) (verbally reviewed HO, excercise not performed)   Assessment/Plan    PT Assessment Patient needs continued PT services  PT Problem List Decreased strength;Decreased range of motion;Decreased activity tolerance;Decreased balance;Decreased mobility;Decreased coordination;Decreased cognition;Pain       PT Treatment Interventions DME instruction;Gait training;Functional mobility training;Therapeutic activities;Therapeutic exercise;Balance training;Neuromuscular re-education;Modalities    PT Goals (Current goals can be found in the Care Plan section)  Acute Rehab PT Goals Patient Stated Goal: to be able to walk 2 miles and go to [employer] in March with the grandchildren PT Goal Formulation: With patient Time For Goal Achievement: 04/04/23 Potential  to Achieve Goals: Good    Frequency 7X/week     Co-evaluation               AM-PAC PT "6 Clicks" Mobility  Outcome Measure Help needed turning from your back to your side while in a flat bed without using bedrails?: A Little Help needed moving from lying on your back to sitting on the side of a flat bed without using bedrails?: A Little Help needed moving to and from a bed to a chair (including a wheelchair)?: A Little Help needed standing up from a chair using your arms (e.g., wheelchair or bedside chair)?: A Little Help needed to walk in hospital room?: A  Little Help needed climbing 3-5 steps with a railing? : Total 6 Click Score: 16    End of Session Equipment Utilized During Treatment: Gait belt Activity Tolerance: Patient tolerated treatment well Patient left: in chair;with call bell/phone within reach;with family/visitor present   PT Visit Diagnosis: Unsteadiness on feet (R26.81);Other abnormalities of gait and mobility (R26.89);Muscle weakness (generalized) (M62.81);History of falling (Z91.81);Difficulty in walking, not elsewhere classified (R26.2);Pain Pain - Right/Left: Left Pain - part of body: Knee;Leg    Time: 1610-9604 PT Time Calculation (min) (ACUTE ONLY): 49 min   Charges:   PT Evaluation $PT Eval Low Complexity: 1 Low PT Treatments $Gait Training: 8-22 mins $Therapeutic Activity: 8-22 mins PT General Charges $$ ACUTE PT VISIT: 1 Visit         Johnny Bridge, PT Acute Rehab   Jacqualyn Posey 03/21/2023, 2:54 PM

## 2023-03-21 NOTE — Transfer of Care (Signed)
Immediate Anesthesia Transfer of Care Note  Patient: Sean Parks  Procedure(s) Performed: LEFT TOTAL KNEE REVISION (Left: Knee)  Patient Location: PACU  Anesthesia Type:GA combined with regional for post-op pain  Level of Consciousness: drowsy and patient cooperative  Airway & Oxygen Therapy: Patient Spontanous Breathing and Patient connected to face mask oxygen  Post-op Assessment: Report given to RN and Patient moving all extremities  Post vital signs: Reviewed and stable  Last Vitals:  Vitals Value Taken Time  BP 124/83 03/21/23 1048  Temp    Pulse 101 03/21/23 1048  Resp 14 03/21/23 1048  SpO2 99 % 03/21/23 1048  Vitals shown include unfiled device data.  Last Pain:  Vitals:   03/21/23 1048  TempSrc:   PainSc: 0-No pain         Complications: No notable events documented.

## 2023-03-26 ENCOUNTER — Encounter (HOSPITAL_COMMUNITY): Payer: Self-pay | Admitting: Orthopedic Surgery

## 2023-03-28 NOTE — Op Note (Signed)
TOTAL KNEE REPLACEMENT OPERATIVE NOTE:  03/21/2023  7:56 AM  PATIENT:  Sean Parks  67 y.o. male  PRE-OPERATIVE DIAGNOSIS:  Left knee total mechanical loosening  POST-OPERATIVE DIAGNOSIS:  Left knee total mechanical loosening  PROCEDURE:  Procedure(s): LEFT TOTAL KNEE REVISION  SURGEON:  Surgeon(s): Dannielle Huh, MD  PHYSICIAN ASSISTANT: Laurier Nancy, PA-C  ANESTHESIA:   general  SPECIMEN: None  COUNTS:  Correct  TOURNIQUET:   Total Tourniquet Time Documented: Thigh (Left) - 112 minutes Thigh (Left) - 35 minutes Total: Thigh (Left) - 147 minutes   DICTATION:  Indication for procedure:    The patient is a 67 y.o. male who has failed conservative treatment for Left knee total mechanical loosening.  Informed consent was obtained prior to anesthesia. The risks versus benefits of the operation were explain and in a way the patient can, and did, understand.    Description of procedure:     The patient was taken to the operating room and placed under anesthesia.  The patient was positioned in the usual fashion taking care that all body parts were adequately padded and/or protected.  A tourniquet was applied and the leg prepped and draped in the usual sterile fashion.  The extremity was exsanguinated with the esmarch and tourniquet inflated to 300 mmHg.    A midline incision approximately 6-7 inches long was made with a #10 blade.  A new blade was used to make a parapatellar arthrotomy going 2-3 cm into the quadriceps tendon, over the patella, and alongside the medial aspect of the patellar tendon.  A synovectomy was then performed with the #10 blade and forceps. I then elevated the deep MCL off the medial tibial metaphysis subperiosteally around to the semimembranosus attachment.     I everted the patella and debrided it and there was no evidence of wear or loosening of previous patellar component so it was left alone.    A homan retractor was place to retract and protect  the patella and lateral structures.  A Z-retractor was place medially to protect the medial structures.  A micro e saw was used to loosen the femoral component and revision osteotomes were used to aide in removal. Cement was debrided and removed using a rongeur and there was mild bone loss noted on the posterior medial femoral condyle.   I then did the same technique on the tibia and removed the component with a micro e saw and revision osteotomes. Cement was removed with a curette and large rongeur. 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      I then marked out the epicondylar axis on the distal femur.   I then used the anterior referencing sizer and measured the femur to be a size 9.  The 4-In-1 cutting block was screwed into place in external rotation matching the posterior condylar angle, making our cuts perpendicular to the epicondylar axis.  Anterior, posterior  chamfer cuts were made with the sagittal saw.  The cutting block and cut pieces were removed. I used a starter reamer on power and then reamed up by hand to a 20 mm by with afforded good fixation. We used 5mm posterior median  augment which allowed for good bone contact throughout.    The Tibia was delivered forward and a size E revision tibia was selected. It was prepped normally and then I used a starter reamer on power and then reamed up by hand to a 13mm by which afforded  good fixation. Cutting guides were used to freshen up the cut and the medial defect was prepped with a 15mm half block tibial augment.   I then trialed with the size 9 femur with 5mm posterior medial augment, size E tibia with 15mm half block tibial augment, a  22  mm insert.  I had excellent flexion/extension gap balance, excellent patella tracking.  Flexion was full and beyond 120 degrees; extension was zero.  These components were chosen and the staff opened them to me on the back table while the knee was lavaged  copiously and the cement mixed.  The soft tissue was infiltrated with 60cc of exparel 1.3% through a 21 gauge needle.  I cemented in the components and removed all excess cement.  The polyethylene tibial component was snapped into place and the knee placed in extension while cement was hardening.  The capsule was infilltrated with a 60cc exparel/marcaine/saline mixture.   Once the cement was hard, the tourniquet was let down.  Hemostasis was obtained.  The arthrotomy was closed using a #1 stratofix running suture.  The deep soft tissues were closed with #0 vicryls and the subcuticular layer closed with #2-0 vicryl.  The skin was reapproximated and closed with 3.0 Monocryl.  The wound was covered with steristrips, aquacel dressing, and a TED stocking.   The patient was then awakened, extubated, and taken to the recovery room in stable condition.  BLOOD LOSS:  300cc COMPLICATIONS:  None.  PLAN OF CARE: Discharge to home after PACU  PATIENT DISPOSITION:  PACU - hemodynamically stable.   Delay start of Pharmacological VTE agent (>24hrs) due to surgical blood loss or risk of bleeding:  yes  Please fax a copy of this op note to my office at [phone] (please only include page 1 and 2 of the Case Information op note)

## 2023-03-31 NOTE — Discharge Summary (Signed)
SPORTS MEDICINE & JOINT REPLACEMENT   Sean Spurling, MD   Sean Nancy, PA-C [address] [place], [place], [place]  [address]                             [phone]  PATIENT ID: Sean Parks        MRN:  [id number]          DOB/AGE: Aug 28, 1955 / 67 y.o.    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 admitted on 03/21/2023 and found to have a diagnosis of Left knee total mechanical loosening.  After appropriate laboratory studies were obtained  they were taken to the operating room on 03/21/2023 and underwent Procedure(s): LEFT TOTAL KNEE REVISION.   They were given perioperative antibiotics:  Anti-infectives (From admission, onward)    Start     Dose/Rate Route Frequency Ordered Stop   03/21/23 0600  ceFAZolin (ANCEF) IVPB 3g/100 mL premix        3 g 200 mL/hr over 30 Minutes Intravenous On call to O.R. 03/21/23 0540 03/21/23 0803     .  Patient given tranexamic acid IV or topical and exparel intra-operatively.  Tolerated the procedure well.    POD# 1: Vital signs were stable.  Patient denied Chest pain, shortness of breath, or calf pain.  Patient was started on Aspirin twice daily at 8am.  Consults to PT, OT, and care management were made.  The patient was weight bearing as tolerated.  CPM was placed on the operative leg 0-90 degrees for  6-8 hours a day. When out of the CPM, patient was placed in the foam block to achieve full extension. Incentive spirometry was taught.  Dressing was changed.       POD #2, Continued  PT for ambulation and exercise program.  IV saline locked.  O2 discontinued.    The remainder of the hospital course was dedicated to ambulation and strengthening.   The patient was discharged on same day post op in  Good condition.  Blood products given:none  DIAGNOSTIC STUDIES: Recent vital signs: No data found.     Recent laboratory studies: No results for input(s): "WBC", "HGB", "HCT", "PLT" in the last 168 hours. No results for input(s): "NA", "K", "CL", "CO2", "BUN", "CREATININE", "GLUCOSE", "CALCIUM" in the last 168 hours. Lab Results  Component Value Date   INR 0.91 02/22/2013     Recent Radiographic Studies :  No results found.  DISCHARGE INSTRUCTIONS: Discharge Instructions     Call MD / Call 911   Complete by: As directed    If you experience chest pain or shortness of breath, CALL 911 and be transported to the hospital emergency room.  If you develope a fever above 101 F, pus (white drainage) or increased drainage or redness at the wound, or calf pain, call your surgeon's office.   Constipation Prevention   Complete by: As directed    Drink plenty of fluids.  Prune juice may be helpful.  You may use a stool softener, such as Colace (over the counter) 100 mg twice a day.  Use MiraLax (over the counter) for constipation as needed.   Diet - low sodium heart healthy   Complete by: As directed    Increase activity slowly as tolerated   Complete by: As directed    Post-operative opioid taper instructions:   Complete by: As directed    POST-OPERATIVE OPIOID TAPER INSTRUCTIONS: It is important to wean off of your opioid medication as soon as possible. If you do not need pain medication after your surgery it is ok to stop day one. Opioids include: Codeine, Hydrocodone(Norco, Vicodin),  Oxycodone(Percocet, oxycontin) and hydromorphone amongst others.  Long term and even short term use of opiods can cause: Increased pain response Dependence Constipation Depression Respiratory depression And more.  Withdrawal symptoms can include Flu like symptoms Nausea, vomiting And more Techniques to manage these symptoms Hydrate well Eat regular healthy meals Stay active Use relaxation techniques(deep breathing, meditating, yoga) Do Not substitute Alcohol to help with tapering If you have been on opioids for less than two weeks and do not have pain than it is ok to stop all together.  Plan to wean off of opioids This plan should start within one week post op of your joint replacement. Maintain the same interval or time between taking each dose and first decrease the dose.  Cut the total daily intake of opioids by one tablet each day Next start to increase the time between doses. The last dose that should be eliminated is the evening dose.          DISCHARGE MEDICATIONS:   Allergies as of 03/21/2023   No Known Allergies      Medication List     STOP taking these medications    ibuprofen 200 MG tablet Commonly known as: ADVIL       TAKE these medications    aspirin 81 MG chewable tablet Chew 1 tablet (81 mg total) by mouth 2 (two) times daily.   methocarbamol 500 MG tablet Commonly known as: ROBAXIN Take 1-2 tablets (500-1,000 mg total) by mouth 4 (four) times daily.   oxyCODONE 5 MG immediate release tablet Commonly known as: Roxicodone Take 1-2 tablets (5-10 mg total) by mouth every 6 (six) hours as needed.        FOLLOW UP VISIT:    DISPOSITION: HOME VS. SNF  Dental Antibiotics:  In most cases prophylactic antibiotics for Dental procdeures after total joint surgery are not necessary.  Exceptions are as follows:  1. History of prior total joint infection  2. Severely immunocompromised (Organ Transplant, cancer chemotherapy, Rheumatoid  biologic meds such as Humera)  3. Poorly controlled diabetes (A1C &gt; 8.0, blood glucose over 200)  If you have one of these conditions, contact your surgeon for an antibiotic prescription, prior to your dental procedure.   CONDITION:  Good   Sean Parks 03/31/2023, 10:22 AM
